# Patient Record
Sex: Female | Born: 1990 | Hispanic: No | Marital: Single | Smoking: Never smoker
Health system: Southern US, Community
[De-identification: ages and names within clinical notes are randomized; demographics above are authoritative.]

## PROBLEM LIST (undated history)

## (undated) HISTORY — PX: PLACEMENT OF BREAST IMPLANTS: SHX6334

---
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
## 2011-08-23 DIAGNOSIS — S63289A Dislocation of proximal interphalangeal joint of unspecified finger, initial encounter: Secondary | ICD-10-CM | POA: Insufficient documentation

## 2013-04-20 DIAGNOSIS — R070 Pain in throat: Secondary | ICD-10-CM | POA: Insufficient documentation

## 2015-07-04 ENCOUNTER — Other Ambulatory Visit (HOSPITAL_COMMUNITY)
Admission: RE | Admit: 2015-07-04 | Discharge: 2015-07-04 | Disposition: A | Discharge status: Home / Self Care | Payer: BLUE CROSS/BLUE SHIELD | Source: Ambulatory Visit | Attending: Family | Admitting: Family

## 2015-07-04 ENCOUNTER — Other Ambulatory Visit: Payer: Self-pay

## 2015-07-04 DIAGNOSIS — Z01411 Encounter for gynecological examination (general) (routine) with abnormal findings: Secondary | ICD-10-CM | POA: Diagnosis not present

## 2015-07-04 DIAGNOSIS — N760 Acute vaginitis: Secondary | ICD-10-CM | POA: Diagnosis present

## 2015-07-04 DIAGNOSIS — Z113 Encounter for screening for infections with a predominantly sexual mode of transmission: Secondary | ICD-10-CM | POA: Insufficient documentation

## 2015-07-06 LAB — CYTOLOGY - PAP

## 2016-02-14 DIAGNOSIS — Z3041 Encounter for surveillance of contraceptive pills: Secondary | ICD-10-CM | POA: Diagnosis not present

## 2017-04-09 DIAGNOSIS — Z3041 Encounter for surveillance of contraceptive pills: Secondary | ICD-10-CM | POA: Diagnosis not present

## 2017-07-19 DIAGNOSIS — Z113 Encounter for screening for infections with a predominantly sexual mode of transmission: Secondary | ICD-10-CM | POA: Diagnosis not present

## 2017-07-19 DIAGNOSIS — N760 Acute vaginitis: Secondary | ICD-10-CM | POA: Diagnosis not present

## 2017-07-19 DIAGNOSIS — Z114 Encounter for screening for human immunodeficiency virus [HIV]: Secondary | ICD-10-CM | POA: Diagnosis not present

## 2017-07-22 ENCOUNTER — Other Ambulatory Visit: Payer: Self-pay | Admitting: Obstetrics and Gynecology

## 2017-07-22 ENCOUNTER — Other Ambulatory Visit (HOSPITAL_COMMUNITY)
Admission: RE | Admit: 2017-07-22 | Discharge: 2017-07-22 | Disposition: A | Discharge status: Home / Self Care | Payer: BLUE CROSS/BLUE SHIELD | Source: Ambulatory Visit | Attending: Obstetrics and Gynecology | Admitting: Obstetrics and Gynecology

## 2017-07-22 DIAGNOSIS — N898 Other specified noninflammatory disorders of vagina: Secondary | ICD-10-CM | POA: Diagnosis not present

## 2017-07-22 DIAGNOSIS — Z124 Encounter for screening for malignant neoplasm of cervix: Secondary | ICD-10-CM | POA: Insufficient documentation

## 2017-07-22 DIAGNOSIS — Z01419 Encounter for gynecological examination (general) (routine) without abnormal findings: Secondary | ICD-10-CM | POA: Diagnosis not present

## 2017-07-23 LAB — CYTOLOGY - PAP: Diagnosis: NEGATIVE

## 2017-09-16 DIAGNOSIS — N761 Subacute and chronic vaginitis: Secondary | ICD-10-CM | POA: Diagnosis not present

## 2017-09-27 DIAGNOSIS — Z113 Encounter for screening for infections with a predominantly sexual mode of transmission: Secondary | ICD-10-CM | POA: Diagnosis not present

## 2017-09-27 DIAGNOSIS — Z114 Encounter for screening for human immunodeficiency virus [HIV]: Secondary | ICD-10-CM | POA: Diagnosis not present

## 2017-12-18 DIAGNOSIS — L237 Allergic contact dermatitis due to plants, except food: Secondary | ICD-10-CM | POA: Diagnosis not present

## 2018-02-25 ENCOUNTER — Encounter: Payer: Self-pay | Admitting: Family Medicine

## 2018-02-25 ENCOUNTER — Ambulatory Visit: Payer: BLUE CROSS/BLUE SHIELD | Admitting: Family Medicine

## 2018-02-25 VITALS — BP 104/66 | HR 81 | Temp 98.6°F | Ht 66.0 in | Wt 132.2 lb

## 2018-02-25 DIAGNOSIS — Z0184 Encounter for antibody response examination: Secondary | ICD-10-CM | POA: Diagnosis not present

## 2018-02-25 DIAGNOSIS — Z0000 Encounter for general adult medical examination without abnormal findings: Secondary | ICD-10-CM

## 2018-02-25 DIAGNOSIS — Z13220 Encounter for screening for lipoid disorders: Secondary | ICD-10-CM

## 2018-02-25 DIAGNOSIS — Z23 Encounter for immunization: Secondary | ICD-10-CM

## 2018-02-25 DIAGNOSIS — R5383 Other fatigue: Secondary | ICD-10-CM | POA: Diagnosis not present

## 2018-02-25 LAB — LIPID PANEL
Cholesterol: 200 mg/dL (ref 0–200)
HDL: 58.8 mg/dL (ref >39.00)
LDL Cholesterol: 129 mg/dL — ABNORMAL HIGH (ref 0–99)
NonHDL: 141.42
Total CHOL/HDL Ratio: 3
Triglycerides: 62 mg/dL (ref 0.0–149.0)
VLDL: 12.4 mg/dL (ref 0.0–40.0)

## 2018-02-25 LAB — COMPREHENSIVE METABOLIC PANEL
ALT: 17 U/L (ref 0–35)
AST: 19 U/L (ref 0–37)
Albumin: 4.3 g/dL (ref 3.5–5.2)
Alkaline Phosphatase: 27 U/L — ABNORMAL LOW (ref 39–117)
BUN: 10 mg/dL (ref 6–23)
CO2: 26 mEq/L (ref 19–32)
Calcium: 9.3 mg/dL (ref 8.4–10.5)
Chloride: 104 mEq/L (ref 96–112)
Creatinine, Ser: 0.75 mg/dL (ref 0.40–1.20)
GFR: 98.49 mL/min (ref >60.00)
Glucose, Bld: 91 mg/dL (ref 70–99)
Potassium: 4.1 mEq/L (ref 3.5–5.1)
Sodium: 137 mEq/L (ref 135–145)
Total Bilirubin: 0.6 mg/dL (ref 0.2–1.2)
Total Protein: 7.2 g/dL (ref 6.0–8.3)

## 2018-02-25 LAB — CBC WITH DIFFERENTIAL/PLATELET
Basophils Absolute: 0 10*3/uL (ref 0.0–0.1)
Basophils Relative: 0.6 % (ref 0.0–3.0)
Eosinophils Absolute: 0.2 10*3/uL (ref 0.0–0.7)
Eosinophils Relative: 4.2 % (ref 0.0–5.0)
HCT: 38.9 % (ref 36.0–46.0)
Hemoglobin: 13 g/dL (ref 12.0–15.0)
Lymphocytes Relative: 50.7 % — ABNORMAL HIGH (ref 12.0–46.0)
Lymphs Abs: 2.4 10*3/uL (ref 0.7–4.0)
MCHC: 33.4 g/dL (ref 30.0–36.0)
MCV: 94.3 fl (ref 78.0–100.0)
Monocytes Absolute: 0.3 10*3/uL (ref 0.1–1.0)
Monocytes Relative: 7.3 % (ref 3.0–12.0)
Neutro Abs: 1.8 10*3/uL (ref 1.4–7.7)
Neutrophils Relative %: 37.2 % — ABNORMAL LOW (ref 43.0–77.0)
Platelets: 261 10*3/uL (ref 150.0–400.0)
RBC: 4.12 Mil/uL (ref 3.87–5.11)
RDW: 12.7 % (ref 11.5–15.5)
WBC: 4.7 10*3/uL (ref 4.0–10.5)

## 2018-02-25 NOTE — Progress Notes (Signed)
Subjective:    Sherri Moreno is a 27 y.o. female and is here for a comprehensive physical exam.   Current Outpatient Medications:  .  Ferrous Sulfate (IRON SUPPLEMENT PO), Take 45 mg by mouth., Disp: , Rfl:  .  BLISOVI FE 1.5/30 1.5-30 MG-MCG tablet, TK 1 T PO QD, Disp: , Rfl: 3  Health Maintenance Due  Topic Date Due  . HIV Screening  02/11/2006  . TETANUS/TDAP  02/11/2010  . INFLUENZA VACCINE  12/19/2017    PMHx, SurgHx, SocialHx, Medications, and Allergies were reviewed in the Visit Navigator and updated as appropriate.   History reviewed. No pertinent past medical history.   Past Surgical History:  Procedure Laterality Date  . PLACEMENT OF BREAST IMPLANTS       Family History  Problem Relation Age of Onset  . Depression Mother   . Heart attack Father     Social History   Tobacco Use  . Smoking status: Never Smoker  . Smokeless tobacco: Never Used  Substance Use Topics  . Alcohol use: Yes    Comment: social   . Drug use: Never    Review of Systems:   Pertinent items are noted in the HPI. Otherwise, ROS is negative.  Objective:   BP 104/66   Pulse 81   Temp 98.6 F (37 C) (Oral)   Ht 5\' 6"  (1.676 m)   Wt 132 lb 3.2 oz (60 kg)   LMP 02/23/2018   SpO2 100%   BMI 21.34 kg/m    General appearance: alert, cooperative and appears stated age. Head: normocephalic, without obvious abnormality, atraumatic. Neck: no adenopathy, supple, symmetrical, trachea midline; thyroid not enlarged, symmetric, no tenderness/mass/nodules. Lungs: clear to auscultation bilaterally. Heart: regular rate and rhythm Abdomen: soft, non-tender; no masses,  no organomegaly. Extremities: extremities normal, atraumatic, no cyanosis or edema. Skin: skin color, texture, turgor normal, no rashes or lesions. Lymph: cervical, supraclavicular, and axillary nodes normal; no abnormal inguinal nodes palpated. Neurologic: grossly normal.  Assessment/Plan:   Sherri Moreno was seen today for  establish care.  Diagnoses and all orders for this visit:  Routine physical examination  Encounter for screening for lipid disorder -     Lipid panel  Fatigue, unspecified type -     CBC with Differential/Platelet -     Comprehensive metabolic panel  Immunity status testing -     Varicella zoster antibody, IgG  Need for immunization against influenza -     Flu Vaccine QUAD 36+ mos IM  Need for prophylactic vaccination against diphtheria-tetanus-pertussis (DTP) -     Tdap vaccine greater than or equal to 7yo IM   Patient Counseling:   [x]     Nutrition: Stressed importance of moderation in sodium/caffeine intake, saturated fat and cholesterol, caloric balance, sufficient intake of fresh fruits, vegetables, fiber, calcium, iron, and 1 mg of folate supplement per day (for females capable of pregnancy).   [x]      Stressed the importance of regular exercise.    [x]     Substance Abuse: Discussed cessation/primary prevention of tobacco, alcohol, or other drug use; driving or other dangerous activities under the influence; availability of treatment for abuse.    [x]      Injury prevention: Discussed safety belts, safety helmets, smoke detector, smoking near bedding or upholstery.    [x]      Sexuality: Discussed sexually transmitted diseases, partner selection, use of condoms, avoidance of unintended pregnancy  and contraceptive alternatives.    [x]     Dental health: Discussed importance  of regular tooth brushing, flossing, and dental visits.   [x]      Health maintenance and immunizations reviewed. Please refer to Health maintenance section.   Helane Rima, DO [facility]

## 2018-02-26 LAB — VARICELLA ZOSTER ANTIBODY, IGG: Varicella IgG: 605 index

## 2018-03-03 ENCOUNTER — Telehealth: Payer: Self-pay | Admitting: Family Medicine

## 2018-03-03 NOTE — Telephone Encounter (Signed)
See note  Copied from CRM [id number]. Topic: General - Other >> Mar 03, 2018  9:52 AM Percival Spanish wrote:  Pt is needing a sign note saying that she had the flu shot .Will pick up when ready

## 2018-03-03 NOTE — Telephone Encounter (Signed)
Sherri Moreno has taken care of this and documented in lab results.

## 2018-12-22 ENCOUNTER — Other Ambulatory Visit: Payer: Self-pay | Admitting: Obstetrics and Gynecology

## 2018-12-22 ENCOUNTER — Other Ambulatory Visit (HOSPITAL_COMMUNITY)
Admission: RE | Admit: 2018-12-22 | Discharge: 2018-12-22 | Disposition: A | Discharge status: Home / Self Care | Payer: BLUE CROSS/BLUE SHIELD | Source: Ambulatory Visit | Attending: Obstetrics and Gynecology | Admitting: Obstetrics and Gynecology

## 2018-12-22 DIAGNOSIS — Z01419 Encounter for gynecological examination (general) (routine) without abnormal findings: Secondary | ICD-10-CM | POA: Diagnosis not present

## 2018-12-26 LAB — CYTOLOGY - PAP: HPV: NOT DETECTED

## 2019-04-17 ENCOUNTER — Emergency Department (HOSPITAL_COMMUNITY)
Admission: EM | Admit: 2019-04-17 | Discharge: 2019-04-17 | Disposition: A | Discharge status: Home / Self Care | Payer: Medicaid Other | Attending: Emergency Medicine | Admitting: Emergency Medicine

## 2019-04-17 ENCOUNTER — Other Ambulatory Visit: Payer: Self-pay

## 2019-04-17 DIAGNOSIS — Z5321 Procedure and treatment not carried out due to patient leaving prior to being seen by health care provider: Secondary | ICD-10-CM

## 2019-04-23 DIAGNOSIS — Z03818 Encounter for observation for suspected exposure to other biological agents ruled out: Secondary | ICD-10-CM | POA: Diagnosis not present

## 2019-04-23 DIAGNOSIS — Z20828 Contact with and (suspected) exposure to other viral communicable diseases: Secondary | ICD-10-CM | POA: Diagnosis not present

## 2019-12-23 DIAGNOSIS — R350 Frequency of micturition: Secondary | ICD-10-CM | POA: Diagnosis not present

## 2019-12-23 DIAGNOSIS — Z01419 Encounter for gynecological examination (general) (routine) without abnormal findings: Secondary | ICD-10-CM | POA: Diagnosis not present

## 2020-04-22 ENCOUNTER — Encounter: Payer: Medicaid Other | Admitting: Family Medicine

## 2020-05-02 ENCOUNTER — Other Ambulatory Visit: Payer: Self-pay

## 2020-05-02 ENCOUNTER — Encounter: Payer: Self-pay | Admitting: Family Medicine

## 2020-05-02 ENCOUNTER — Ambulatory Visit (INDEPENDENT_AMBULATORY_CARE_PROVIDER_SITE_OTHER): Payer: BC Managed Care – PPO | Admitting: Family Medicine

## 2020-05-02 VITALS — BP 108/68 | HR 74 | Temp 97.7°F | Ht 66.0 in | Wt 130.6 lb

## 2020-05-02 DIAGNOSIS — Z0001 Encounter for general adult medical examination with abnormal findings: Secondary | ICD-10-CM

## 2020-05-02 DIAGNOSIS — Z111 Encounter for screening for respiratory tuberculosis: Secondary | ICD-10-CM | POA: Diagnosis not present

## 2020-05-02 DIAGNOSIS — E7800 Pure hypercholesterolemia, unspecified: Secondary | ICD-10-CM

## 2020-05-02 NOTE — Assessment & Plan Note (Signed)
Check lipids, CBC, CMET, TSH.  Discussed lifestyle modifications. 

## 2020-05-02 NOTE — Progress Notes (Signed)
Chief Complaint:  Sherri Moreno is a 29 y.o. female who presents today for her annual comprehensive physical exam.    Assessment/Plan:  Chronic Problems Addressed Today: Elevated LDL cholesterol level Check lipids, CBC, CMET, TSH. Discussed lifestyle modifications.   Preventative Healthcare: PPD given today. UTD on vaccines and screenings. Check CBC, CMET, TSH, and lipids.   Patient Counseling(The following topics were reviewed and/or handout was given):  -Nutrition: Stressed importance of moderation in sodium/caffeine intake, saturated fat and cholesterol, caloric balance, sufficient intake of fresh fruits, vegetables, and fiber.  -Stressed the importance of regular exercise.   -Substance Abuse: Discussed cessation/primary prevention of tobacco, alcohol, or other drug use; driving or other dangerous activities under the influence; availability of treatment for abuse.   -Injury prevention: Discussed safety belts, safety helmets, smoke detector, smoking near bedding or upholstery.   -Sexuality: Discussed sexually transmitted diseases, partner selection, use of condoms, avoidance of unintended pregnancy and contraceptive alternatives.   -Dental health: Discussed importance of regular tooth brushing, flossing, and dental visits.  -Health maintenance and immunizations reviewed. Please refer to Health maintenance section.  Return to care in 1 year for next preventative visit.     Subjective:  HPI:  She has no acute complaints today.   Lifestyle Diet: Tries eat healthy. Tries to get plenty of fruits and vegetables.  Exercise: Exercise as much as possible.   Depression screen PHQ 2/9 05/02/2020  Decreased Interest 0  Down, Depressed, Hopeless 0  PHQ - 2 Score 0    Health Maintenance Due  Topic Date Due  . Hepatitis C Screening  Never done     ROS: Per HPI, otherwise a complete review of systems was negative.   PMH:  The following were reviewed and entered/updated in  epic: History reviewed. No pertinent past medical history. Patient Active Problem List   Diagnosis Date Noted  . Elevated LDL cholesterol level 05/02/2020   Past Surgical History:  Procedure Laterality Date  . PLACEMENT OF BREAST IMPLANTS      Family History  Problem Relation Age of Onset  . Depression Mother   . Heart attack Father     Medications- reviewed and updated Current Outpatient Medications  Medication Sig Dispense Refill  . BLISOVI FE 1.5/30 1.5-30 MG-MCG tablet TK 1 T PO QD  3   No current facility-administered medications for this visit.    Allergies-reviewed and updated Allergies  Allergen Reactions  . Ibuprofen Swelling    Lips swell     Social History   Socioeconomic History  . Marital status: Single    Spouse name: Not on file  . Number of children: Not on file  . Years of education: Not on file  . Highest education level: Not on file  Occupational History  . Not on file  Tobacco Use  . Smoking status: Never Smoker  . Smokeless tobacco: Never Used  Substance and Sexual Activity  . Alcohol use: Yes    Comment: social   . Drug use: Never  . Sexual activity: Yes    Partners: Male    Birth control/protection: Pill  Other Topics Concern  . 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    Social Determinants of Health   Financial Resource Strain: Not on file  Food Insecurity: Not on file  Transportation Needs: Not on file  Physical Activity: Not on file  Stress: Not on file  Social Connections: Not on file  Objective:  Physical Exam: BP 108/68   Pulse 74   Temp 97.7 F (36.5 C) (Temporal)   Ht 5\' 6"  (1.676 m)   Wt 130 lb 9.6 oz (59.2 kg)   LMP 04/16/2020   SpO2 100%   BMI 21.08 kg/m   Body mass index is 21.08 kg/m. Wt Readings from Last 3 Encounters:  05/02/20 130 lb 9.6 oz (59.2 kg)  02/25/18 132 lb 3.2 oz (60 kg)   Gen: NAD, resting comfortably HEENT: TMs normal bilaterally. OP clear.  No thyromegaly noted.  CV: RRR with no murmurs appreciated Pulm: NWOB, CTAB with no crackles, wheezes, or rhonchi GI: Normal bowel sounds present. Soft, Nontender, Nondistended. MSK: no edema, cyanosis, or clubbing noted Skin: warm, dry Neuro: CN2-12 grossly intact. Strength 5/5 in upper and lower extremities. Reflexes symmetric and intact bilaterally.  Psych: Normal affect and thought content     Pasha Gadison M. 04/27/18, MD 05/02/2020 9:53 AM

## 2020-05-02 NOTE — Patient Instructions (Signed)
It was very nice to see you today!  We will check lab work today.  We will give you your TB skin test.   We will see you back in a year or so.  Please come back to see me sooner if needed.  Take care, Dr Jerline Pain  Please try these tips to maintain a healthy lifestyle:   Eat at least 3 REAL meals and 1-2 snacks per day.  Aim for no more than 5 hours between eating.  If you eat breakfast, please do so within one hour of getting up.    Each meal should contain half fruits/vegetables, one quarter protein, and one quarter carbs (no bigger than a computer mouse)   Cut down on sweet beverages. This includes juice, soda, and sweet tea.     Drink at least 1 glass of water with each meal and aim for at least 8 glasses per day   Exercise at least 150 minutes every week.    Preventive Care 73-79 Years Old, Female Preventive care refers to visits with your health care provider and lifestyle choices that can promote health and wellness. This includes:  A yearly physical exam. This may also be called an annual well check.  Regular dental visits and eye exams.  Immunizations.  Screening for certain conditions.  Healthy lifestyle choices, such as eating a healthy diet, getting regular exercise, not using drugs or products that contain nicotine and tobacco, and limiting alcohol use. What can I expect for my preventive care visit? Physical exam Your health care provider will check your:  Height and weight. This may be used to calculate body mass index (BMI), which tells if you are at a healthy weight.  Heart rate and blood pressure.  Skin for abnormal spots. Counseling Your health care provider may ask you questions about your:  Alcohol, tobacco, and drug use.  Emotional well-being.  Home and relationship well-being.  Sexual activity.  Eating habits.  Work and work Statistician.  Method of birth control.  Menstrual cycle.  Pregnancy history. What immunizations do I  need?  Influenza (flu) vaccine  This is recommended every year. Tetanus, diphtheria, and pertussis (Tdap) vaccine  You may need a Td booster every 10 years. Varicella (chickenpox) vaccine  You may need this if you have not been vaccinated. Human papillomavirus (HPV) vaccine  If recommended by your health care provider, you may need three doses over 6 months. Measles, mumps, and rubella (MMR) vaccine  You may need at least one dose of MMR. You may also need a second dose. Meningococcal conjugate (MenACWY) vaccine  One dose is recommended if you are age 28-21 years and a first-year college student living in a residence hall, or if you have one of several medical conditions. You may also need additional booster doses. Pneumococcal conjugate (PCV13) vaccine  You may need this if you have certain conditions and were not previously vaccinated. Pneumococcal polysaccharide (PPSV23) vaccine  You may need one or two doses if you smoke cigarettes or if you have certain conditions. Hepatitis A vaccine  You may need this if you have certain conditions or if you travel or work in places where you may be exposed to hepatitis A. Hepatitis B vaccine  You may need this if you have certain conditions or if you travel or work in places where you may be exposed to hepatitis B. Haemophilus influenzae type b (Hib) vaccine  You may need this if you have certain conditions. You may receive vaccines as  individual doses or as more than one vaccine together in one shot (combination vaccines). Talk with your health care provider about the risks and benefits of combination vaccines. What tests do I need?  Blood tests  Lipid and cholesterol levels. These may be checked every 5 years starting at age 2.  Hepatitis C test.  Hepatitis B test. Screening  Diabetes screening. This is done by checking your blood sugar (glucose) after you have not eaten for a while (fasting).  Sexually transmitted disease  (STD) testing.  BRCA-related cancer screening. This may be done if you have a family history of breast, ovarian, tubal, or peritoneal cancers.  Pelvic exam and Pap test. This may be done every 3 years starting at age 12. Starting at age 54, this may be done every 5 years if you have a Pap test in combination with an HPV test. Talk with your health care provider about your test results, treatment options, and if necessary, the need for more tests. Follow these instructions at home: Eating and drinking   Eat a diet that includes fresh fruits and vegetables, whole grains, lean protein, and low-fat dairy.  Take vitamin and mineral supplements as recommended by your health care provider.  Do not drink alcohol if: ? Your health care provider tells you not to drink. ? You are pregnant, may be pregnant, or are planning to become pregnant.  If you drink alcohol: ? Limit how much you have to 0-1 drink a day. ? Be aware of how much alcohol is in your drink. In the [place] one drink equals one 12 oz bottle of beer (355 mL), one 5 oz glass of wine (148 mL), or one 1 oz glass of hard liquor (44 mL). Lifestyle  Take daily care of your teeth and gums.  Stay active. Exercise for at least 30 minutes on 5 or more days each week.  Do not use any products that contain nicotine or tobacco, such as cigarettes, e-cigarettes, and chewing tobacco. If you need help quitting, ask your health care provider.  If you are sexually active, practice safe sex. Use a condom or other form of birth control (contraception) in order to prevent pregnancy and STIs (sexually transmitted infections). If you plan to become pregnant, see your health care provider for a preconception visit. What's next?  Visit your health care provider once a year for a well check visit.  Ask your health care provider how often you should have your eyes and teeth checked.  Stay up to date on all vaccines. This information is not intended to  replace advice given to you by your health care provider. Make sure you discuss any questions you have with your health care provider. Document Revised: 01/16/2018 Document Reviewed: 01/16/2018 Elsevier Patient Education  2020 [employer].

## 2020-05-03 LAB — COMPREHENSIVE METABOLIC PANEL
AG Ratio: 1.5 (calc) (ref 1.0–2.5)
ALT: 16 U/L (ref 6–29)
AST: 17 U/L (ref 10–30)
Albumin: 3.9 g/dL (ref 3.6–5.1)
Alkaline phosphatase (APISO): 25 U/L — ABNORMAL LOW (ref 31–125)
BUN: 9 mg/dL (ref 7–25)
CO2: 25 mmol/L (ref 20–32)
Calcium: 9 mg/dL (ref 8.6–10.2)
Chloride: 106 mmol/L (ref 98–110)
Creat: 0.77 mg/dL (ref 0.50–1.10)
Globulin: 2.6 g/dL (calc) (ref 1.9–3.7)
Glucose, Bld: 82 mg/dL (ref 65–99)
Potassium: 3.9 mmol/L (ref 3.5–5.3)
Sodium: 138 mmol/L (ref 135–146)
Total Bilirubin: 0.7 mg/dL (ref 0.2–1.2)
Total Protein: 6.5 g/dL (ref 6.1–8.1)

## 2020-05-03 LAB — CBC
HCT: 36.8 % (ref 35.0–45.0)
Hemoglobin: 12.6 g/dL (ref 11.7–15.5)
MCH: 31.5 pg (ref 27.0–33.0)
MCHC: 34.2 g/dL (ref 32.0–36.0)
MCV: 92 fL (ref 80.0–100.0)
MPV: 10.2 fL (ref 7.5–12.5)
Platelets: 269 10*3/uL (ref 140–400)
RBC: 4 10*6/uL (ref 3.80–5.10)
RDW: 11.8 % (ref 11.0–15.0)
WBC: 6.6 10*3/uL (ref 3.8–10.8)

## 2020-05-03 LAB — LIPID PANEL
Cholesterol: 181 mg/dL (ref <200)
HDL: 65 mg/dL (ref >=50)
LDL Cholesterol (Calc): 100 mg/dL (calc) — ABNORMAL HIGH
Non-HDL Cholesterol (Calc): 116 mg/dL (calc) (ref <130)
Total CHOL/HDL Ratio: 2.8 (calc) (ref <5.0)
Triglycerides: 74 mg/dL (ref <150)

## 2020-05-03 LAB — TSH: TSH: 4.59 mIU/L — ABNORMAL HIGH

## 2020-05-03 NOTE — Progress Notes (Signed)
Please inform patient of the following:  Thyroid level slightly off. Recommend she come back to check TSH, free t3 and free t4. Everything else within expected ranges.  Sherri Moreno. Jimmey Ralph, MD 05/03/2020 4:22 PM    Sherri Moreno. Jimmey Ralph, MD 05/03/2020 4:22 PM

## 2020-05-04 ENCOUNTER — Other Ambulatory Visit: Payer: Self-pay | Admitting: *Deleted

## 2020-05-04 ENCOUNTER — Ambulatory Visit: Payer: BC Managed Care – PPO

## 2020-05-04 ENCOUNTER — Other Ambulatory Visit: Payer: Self-pay

## 2020-05-04 DIAGNOSIS — R7989 Other specified abnormal findings of blood chemistry: Secondary | ICD-10-CM

## 2020-05-04 DIAGNOSIS — Z111 Encounter for screening for respiratory tuberculosis: Secondary | ICD-10-CM

## 2020-05-04 LAB — TB SKIN TEST: TB Skin Test: NEGATIVE

## 2020-05-04 NOTE — Progress Notes (Signed)
Pt was in the office today for a TB reading. Results were negative.

## 2020-06-15 DIAGNOSIS — Z111 Encounter for screening for respiratory tuberculosis: Secondary | ICD-10-CM | POA: Diagnosis not present

## 2020-08-16 ENCOUNTER — Encounter: Payer: Self-pay | Admitting: Plastic Surgery

## 2020-08-16 ENCOUNTER — Ambulatory Visit (INDEPENDENT_AMBULATORY_CARE_PROVIDER_SITE_OTHER): Payer: BC Managed Care – PPO | Admitting: Family Medicine

## 2020-08-16 ENCOUNTER — Ambulatory Visit (INDEPENDENT_AMBULATORY_CARE_PROVIDER_SITE_OTHER): Payer: Self-pay | Admitting: Plastic Surgery

## 2020-08-16 ENCOUNTER — Other Ambulatory Visit: Payer: Self-pay

## 2020-08-16 VITALS — BP 98/61 | HR 72 | Temp 98.4°F | Ht 66.0 in | Wt 132.6 lb

## 2020-08-16 DIAGNOSIS — R140 Abdominal distension (gaseous): Secondary | ICD-10-CM

## 2020-08-16 DIAGNOSIS — R7989 Other specified abnormal findings of blood chemistry: Secondary | ICD-10-CM | POA: Diagnosis not present

## 2020-08-16 DIAGNOSIS — Z719 Counseling, unspecified: Secondary | ICD-10-CM | POA: Insufficient documentation

## 2020-08-16 DIAGNOSIS — K5900 Constipation, unspecified: Secondary | ICD-10-CM | POA: Diagnosis not present

## 2020-08-16 LAB — T3, FREE: T3, Free: 2.8 pg/mL (ref 2.3–4.2)

## 2020-08-16 LAB — TSH: TSH: 1.95 u[IU]/mL (ref 0.35–4.50)

## 2020-08-16 LAB — T4, FREE: Free T4: 0.76 ng/dL (ref 0.60–1.60)

## 2020-08-16 NOTE — Progress Notes (Signed)
Patient ID: Sherri Moreno, female    DOB: 1990/12/13, 30 y.o.   MRN: [id number]   Chief Complaint  Patient presents with  . Advice Only  . Breast Problem    The patient is a 30 year old female here with her mom.  She has questions about her breasts.  She underwent a breast augmentation by Dr. Stephens November 07/30/14.  The implants are saline and 300 cc.  She believes that they are smooth implants.  They are under the muscle.  She is not a smoker and does not have diabetes.  She is a physical therapy student.  Her sternal notch to right nipple is 22 cm left nipple is 20 cm.  Her sternal notch to right areola is 20 cm on the right and 19 cm on the left.  She does not like the asymmetry in the areola and nipple area.  She is 5 feet 6 inches tall weighs 139 pounds.  She went from a AA to a 34C cup.  She feels like when she sleeps the implants come to close together.  She has been putting a stuffed animal or a pillow between her breasts.  The left breast is a little bit lower than the right at the inferior pole.  This might be improved with excision of an ellipse of skin on the lower pole.  It might not pull the areola down but it might help.   Review of Systems  Constitutional: Negative for activity change and appetite change.  Eyes: Negative.   Respiratory: Negative.  Negative for chest tightness and shortness of breath.   Cardiovascular: Negative for leg swelling.  Gastrointestinal: Negative for abdominal distention and abdominal pain.  Endocrine: Negative.   Genitourinary: Negative.   Musculoskeletal: Negative.   Hematological: Negative.     History reviewed. No pertinent past medical history.  Past Surgical History:  Procedure Laterality Date  . PLACEMENT OF BREAST IMPLANTS        Current Outpatient Medications:  .  BLISOVI FE 1.5/30 1.5-30 MG-MCG tablet, TK 1 T PO QD, Disp: , Rfl: 3   Objective:   Vitals:   08/16/20 1510  BP: 120/70  Pulse: 78  SpO2: 98%    Physical  Exam Vitals and nursing note reviewed.  Constitutional:      Appearance: Normal appearance.  HENT:     Head: Normocephalic and atraumatic.  Cardiovascular:     Rate and Rhythm: Normal rate.     Pulses: Normal pulses.  Pulmonary:     Effort: Pulmonary effort is normal.  Abdominal:     General: Abdomen is flat. There is no distension.     Tenderness: There is no abdominal tenderness.  Skin:    General: Skin is warm.     Capillary Refill: Capillary refill takes less than 2 seconds.     Coloration: Skin is not jaundiced.  Neurological:     General: No focal deficit present.     Mental Status: She is alert and oriented to person, place, and time.  Psychiatric:        Mood and Affect: Mood normal.        Behavior: Behavior normal.     Assessment & Plan:  Encounter for counseling  We discussed the options for revision.  I would be very hesitant to raise the right nipple.  I think that the right nipple areola is in the proper position.  I think the left is a little bit high and attempt could be made  at low-lying that side.  With physical therapy school and training she does not want to do anything until May when she graduates.  She is going to think things over and be back in touch if she decides to do anything.  Alena Bills Lometa Riggin, DO

## 2020-08-16 NOTE — Progress Notes (Signed)
Please inform patient of the following:  Thyroid levels are all NORMAL. We can refer to GI as we discussed or we can try bowel cleanout first to make sure constipation is not causing her symptoms.  Sherri Moreno. Jimmey Ralph, MD 08/16/2020 3:06 PM

## 2020-08-16 NOTE — Progress Notes (Signed)
   Sherri Moreno is a 30 y.o. female who presents today for an office visit.  Assessment/Plan:  Bloating / Constipation Recheck TSH as this was noted to be slightly elevated on last check.  If she does indeed have hypothyroidism we will treat this before exploring other causes of bloating and constipation.  If TSH is normal would consider IBS or other primary GI issue. May have IBS or food intoleraance given her history though dose have constipation which is likely playing a role.  Recommended over-the-counter stool softeners and laxatives such as MiraLAX as needed.  May need referral to GI.     Subjective:  HPI:  Patient here with concerns for abdominal bloating.  This happens intermittently.  Usually happens after eating specific foods such as bread.  Can sometimes happen after eating salad.  Symptoms last for short while and then subside.  This has been going on for at least a couple of years.  No nausea or vomiting.  No epigastric pain.  Occasionally has heartburn after eating specific foods such as pizza but has not had this persistently.  She has intermittent constipation will sometimes go 2 to 3 days between bowel movements.  No diarrhea.  No melena or hematochezia.  She has tried taking Metamucil without significant improvement.  No unintentional weight loss.  No reported early satiety.       Objective:  Physical Exam: BP 98/61   Pulse 72   Temp 98.4 F (36.9 C) (Temporal)   Ht 5\' 6"  (1.676 m)   Wt 132 lb 9.6 oz (60.1 kg)   SpO2 100%   BMI 21.40 kg/m   Gen: No acute distress, resting comfortably CV: Regular rate and rhythm with no murmurs appreciated Pulm: Normal work of breathing, clear to auscultation bilaterally with no crackles, wheezes, or rhonchi GI: S, NT, ND Neuro: Grossly normal, moves all extremities Psych: Normal affect and thought content      Sherri Moreno M. , MD 08/16/2020 10:52 AM

## 2020-08-16 NOTE — Patient Instructions (Signed)
It was very nice to see you today!  We will recheck your thyroid today.  This could be causing some of your symptoms.  If your thyroid level is normal and your symptoms persist we can discuss referring you to see a GI specialist or treatment for IBS.  Take care, Dr Jimmey Ralph  PLEASE NOTE:  If you had any lab tests please let us know if you have not heard back within a few days. You may see your results on mychart before we have a chance to review them but we will give you a call once they are reviewed by [place]. If we ordered any referrals today, please let us know if you have not heard from their office within the next week.   Please try these tips to maintain a healthy lifestyle:   Eat at least 3 REAL meals and 1-2 snacks per day.  Aim for no more than 5 hours between eating.  If you eat breakfast, please do so within one hour of getting up.    Each meal should contain half fruits/vegetables, one quarter protein, and one quarter carbs (no bigger than a computer mouse)   Cut down on sweet beverages. This includes juice, soda, and sweet tea.     Drink at least 1 glass of water with each meal and aim for at least 8 glasses per day   Exercise at least 150 minutes every week.

## 2020-10-18 ENCOUNTER — Ambulatory Visit (HOSPITAL_BASED_OUTPATIENT_CLINIC_OR_DEPARTMENT_OTHER)
Admission: RE | Admit: 2020-10-18 | Discharge: 2020-10-18 | Disposition: A | Discharge status: Home / Self Care | Payer: BC Managed Care – PPO | Source: Ambulatory Visit | Attending: Family Medicine | Admitting: Family Medicine

## 2020-10-18 ENCOUNTER — Ambulatory Visit (INDEPENDENT_AMBULATORY_CARE_PROVIDER_SITE_OTHER): Payer: BC Managed Care – PPO | Admitting: Family Medicine

## 2020-10-18 ENCOUNTER — Encounter: Payer: Self-pay | Admitting: Family Medicine

## 2020-10-18 ENCOUNTER — Other Ambulatory Visit: Payer: Self-pay

## 2020-10-18 VITALS — BP 102/79 | HR 95 | Temp 98.7°F | Ht 66.0 in | Wt 133.0 lb

## 2020-10-18 DIAGNOSIS — R109 Unspecified abdominal pain: Secondary | ICD-10-CM

## 2020-10-18 LAB — POCT URINALYSIS DIPSTICK
Glucose, UA: NEGATIVE
Ketones, UA: NEGATIVE
Leukocytes, UA: NEGATIVE
Nitrite, UA: NEGATIVE
Protein, UA: POSITIVE — AB
Spec Grav, UA: 1.025 (ref 1.010–1.025)
Urobilinogen, UA: 0.2 E.U./dL
pH, UA: 6.5 (ref 5.0–8.0)

## 2020-10-18 LAB — POCT URINE PREGNANCY: Preg Test, Ur: NEGATIVE

## 2020-10-18 MED ORDER — PHENAZOPYRIDINE HCL 200 MG PO TABS: 200.0000 mg | ORAL_TABLET | Freq: Three times a day (TID) | ORAL | 0 refills | Status: DC | PRN

## 2020-10-18 NOTE — Progress Notes (Signed)
   Sherri Moreno is a 30 y.o. female who presents today for an office visit.  Assessment/Plan:  New/Acute Problems: Left flank pain UA positive for blood.  No signs of UTI will check urine culture to rule out. Urine pregnancy negative. She cannot take NSAIDs due to allergies.  Overall pain is mostly tolerable.  We will start Pyridium.  Given symptoms and then persistent for the past few weeks we will check CT scan to further evaluate.  Encourage good oral hydration.  Discussed reasons to return to care.    Subjective:  HPI:  Patient with several weeks of left-sided flank pain.  No obvious injuries or precipitating events.  Pain has been persistent.  Worse with certain motions.  She is concerned about possible kidney stone.  She does not think pain is muscular.  No specific treatments tried.       Objective:  Physical Exam: BP 102/79   Pulse 95   Temp 98.7 F (37.1 C) (Temporal)   Ht 5\' 6"  (1.676 m)   Wt 133 lb (60.3 kg)   LMP 09/24/2020   SpO2 99%   BMI 21.47 kg/m   Gen: No acute distress, resting comfortably CV: Regular rate and rhythm with no murmurs appreciated Pulm: Normal work of breathing, clear to auscultation bilaterally with no crackles, wheezes, or rhonchi Neuro: Grossly normal, moves all extremities Psych: Normal affect and thought content      Sherri Moreno M. 11/24/2020, MD 10/18/2020 2:48 PM

## 2020-10-18 NOTE — Patient Instructions (Signed)
It was very nice to see you today!  Am concerned he may have a kidney stone.  Please start the Pyridium.  Please make sure that you are getting plenty of fluids.  We will check a CT scan to further assess.  Take care, Dr Jimmey Ralph  PLEASE NOTE:  If you had any lab tests please let us know if you have not heard back within a few days. You may see your results on mychart before we have a chance to review them but we will give you a call once they are reviewed by [place]. If we ordered any referrals today, please let us know if you have not heard from their office within the next week.   Please try these tips to maintain a healthy lifestyle:   Eat at least 3 REAL meals and 1-2 snacks per day.  Aim for no more than 5 hours between eating.  If you eat breakfast, please do so within one hour of getting up.    Each meal should contain half fruits/vegetables, one quarter protein, and one quarter carbs (no bigger than a computer mouse)   Cut down on sweet beverages. This includes juice, soda, and sweet tea.     Drink at least 1 glass of water with each meal and aim for at least 8 glasses per day   Exercise at least 150 minutes every week.

## 2020-10-19 LAB — URINE CULTURE
MICRO NUMBER:: 11950194
Result:: NO GROWTH
SPECIMEN QUALITY:: ADEQUATE

## 2020-10-19 NOTE — Progress Notes (Signed)
Please inform patient of the following:  Her ultrasound showed no abnormalities. It is possible that she could have had a kidney stone that passed and is still having some irritation/pain due to that.  I would like for her to come back in a week to recheck a UA to make sure the blood in her urine has cleared.  I would also like for her to let us know if her pain is not improving over the next several days.  Katina Degree. Jimmey Ralph, MD 10/19/2020 8:10 AM

## 2020-10-20 NOTE — Progress Notes (Signed)
Please inform patient of the following:  Urine culture is negative. She does not have a UTI. Please see CT results - would like for her to let us know if symptoms are not improving.  Katina Degree. Jimmey Ralph, MD 10/20/2020 9:45 AM

## 2020-10-21 ENCOUNTER — Other Ambulatory Visit: Payer: Self-pay | Admitting: *Deleted

## 2020-10-21 DIAGNOSIS — R109 Unspecified abdominal pain: Secondary | ICD-10-CM

## 2020-10-24 ENCOUNTER — Other Ambulatory Visit: Payer: Self-pay

## 2020-10-24 ENCOUNTER — Other Ambulatory Visit (INDEPENDENT_AMBULATORY_CARE_PROVIDER_SITE_OTHER): Payer: BC Managed Care – PPO

## 2020-10-24 ENCOUNTER — Ambulatory Visit: Payer: BC Managed Care – PPO | Admitting: Registered Nurse

## 2020-10-24 DIAGNOSIS — R109 Unspecified abdominal pain: Secondary | ICD-10-CM

## 2020-10-24 DIAGNOSIS — R10A2 Flank pain, left side: Secondary | ICD-10-CM

## 2020-10-24 LAB — URINALYSIS, ROUTINE W REFLEX MICROSCOPIC
Bilirubin Urine: NEGATIVE
Hgb urine dipstick: NEGATIVE
Ketones, ur: NEGATIVE
Leukocytes,Ua: NEGATIVE
Nitrite: NEGATIVE
Specific Gravity, Urine: 1.01 (ref 1.000–1.030)
Total Protein, Urine: NEGATIVE
Urine Glucose: NEGATIVE
Urobilinogen, UA: 0.2 (ref 0.0–1.0)
pH: 6.5 (ref 5.0–8.0)

## 2020-10-25 NOTE — Progress Notes (Signed)
Please inform patient of the following:  She no long has blood in her urine. I believe she had a kidney that has passed. Would like for her to let us know if her symptoms have not improved.  Katina Degree. Jimmey Ralph, MD 10/25/2020 8:11 AM

## 2021-07-04 ENCOUNTER — Other Ambulatory Visit: Payer: Self-pay

## 2021-07-04 ENCOUNTER — Ambulatory Visit (INDEPENDENT_AMBULATORY_CARE_PROVIDER_SITE_OTHER): Payer: Self-pay

## 2021-07-04 DIAGNOSIS — Z111 Encounter for screening for respiratory tuberculosis: Secondary | ICD-10-CM

## 2021-07-06 ENCOUNTER — Ambulatory Visit (INDEPENDENT_AMBULATORY_CARE_PROVIDER_SITE_OTHER): Payer: Self-pay | Admitting: *Deleted

## 2021-07-06 DIAGNOSIS — Z111 Encounter for screening for respiratory tuberculosis: Secondary | ICD-10-CM

## 2021-07-06 LAB — TB SKIN TEST
Induration: 0 mm
TB Skin Test: NEGATIVE

## 2021-07-06 NOTE — Progress Notes (Signed)
I have reviewed the patient's encounter and agree with the documentation.  Katina Degree. Jimmey Ralph, MD 07/06/2021 2:44 PM

## 2021-07-06 NOTE — Progress Notes (Signed)
PPD Reading Note PPD read and results entered in Epic[facility]. Result: 0 mm induration. Interpretation: NEGATIVE    Jobe Gibbon, CMA

## 2021-07-07 ENCOUNTER — Ambulatory Visit (INDEPENDENT_AMBULATORY_CARE_PROVIDER_SITE_OTHER): Payer: Self-pay | Admitting: Plastic Surgery

## 2021-07-07 ENCOUNTER — Other Ambulatory Visit: Payer: Self-pay

## 2021-07-07 ENCOUNTER — Encounter: Payer: Self-pay | Admitting: Plastic Surgery

## 2021-07-07 DIAGNOSIS — Z719 Counseling, unspecified: Secondary | ICD-10-CM

## 2021-07-07 MED ORDER — DOXYCYCLINE HYCLATE 100 MG PO TABS: 100.0000 mg | ORAL_TABLET | Freq: Every day | ORAL | 0 refills | Status: DC

## 2021-07-07 NOTE — Progress Notes (Signed)

## 2021-09-26 ENCOUNTER — Other Ambulatory Visit: Payer: Self-pay | Admitting: Obstetrics and Gynecology

## 2021-09-26 ENCOUNTER — Other Ambulatory Visit (HOSPITAL_COMMUNITY)
Admission: RE | Admit: 2021-09-26 | Discharge: 2021-09-26 | Disposition: A | Discharge status: Home / Self Care | Payer: BC Managed Care – PPO | Source: Ambulatory Visit | Attending: Obstetrics and Gynecology | Admitting: Obstetrics and Gynecology

## 2021-09-26 DIAGNOSIS — Z01419 Encounter for gynecological examination (general) (routine) without abnormal findings: Secondary | ICD-10-CM | POA: Diagnosis not present

## 2021-09-29 LAB — CYTOLOGY - PAP
Comment: NEGATIVE
Diagnosis: NEGATIVE
High risk HPV: NEGATIVE

## 2021-12-19 ENCOUNTER — Encounter: Payer: BC Managed Care – PPO | Admitting: Family Medicine

## 2022-01-02 ENCOUNTER — Encounter: Payer: Self-pay | Admitting: Family Medicine

## 2022-01-02 ENCOUNTER — Ambulatory Visit (INDEPENDENT_AMBULATORY_CARE_PROVIDER_SITE_OTHER): Payer: BC Managed Care – PPO | Admitting: Family Medicine

## 2022-01-02 VITALS — BP 114/73 | HR 83 | Temp 98.3°F | Ht 66.0 in | Wt 142.6 lb

## 2022-01-02 DIAGNOSIS — Z1159 Encounter for screening for other viral diseases: Secondary | ICD-10-CM

## 2022-01-02 DIAGNOSIS — Z0001 Encounter for general adult medical examination with abnormal findings: Secondary | ICD-10-CM | POA: Diagnosis not present

## 2022-01-02 DIAGNOSIS — R140 Abdominal distension (gaseous): Secondary | ICD-10-CM | POA: Insufficient documentation

## 2022-01-02 DIAGNOSIS — E7800 Pure hypercholesterolemia, unspecified: Secondary | ICD-10-CM | POA: Diagnosis not present

## 2022-01-02 DIAGNOSIS — R739 Hyperglycemia, unspecified: Secondary | ICD-10-CM

## 2022-01-02 LAB — COMPREHENSIVE METABOLIC PANEL WITH GFR
ALT: 20 U/L (ref 0–35)
AST: 22 U/L (ref 0–37)
Albumin: 4.2 g/dL (ref 3.5–5.2)
Alkaline Phosphatase: 24 U/L — ABNORMAL LOW (ref 39–117)
BUN: 13 mg/dL (ref 6–23)
CO2: 22 meq/L (ref 19–32)
Calcium: 9.4 mg/dL (ref 8.4–10.5)
Chloride: 104 meq/L (ref 96–112)
Creatinine, Ser: 0.85 mg/dL (ref 0.40–1.20)
GFR: 91.63 mL/min
Glucose, Bld: 82 mg/dL (ref 70–99)
Potassium: 4.8 meq/L (ref 3.5–5.1)
Sodium: 138 meq/L (ref 135–145)
Total Bilirubin: 0.3 mg/dL (ref 0.2–1.2)
Total Protein: 6.9 g/dL (ref 6.0–8.3)

## 2022-01-02 LAB — CBC
HCT: 39.8 % (ref 36.0–46.0)
Hemoglobin: 13.2 g/dL (ref 12.0–15.0)
MCHC: 33.2 g/dL (ref 30.0–36.0)
MCV: 94.7 fl (ref 78.0–100.0)
Platelets: 239 10*3/uL (ref 150.0–400.0)
RBC: 4.2 Mil/uL (ref 3.87–5.11)
RDW: 13 % (ref 11.5–15.5)
WBC: 6.6 10*3/uL (ref 4.0–10.5)

## 2022-01-02 LAB — LIPID PANEL
Cholesterol: 180 mg/dL (ref 0–200)
HDL: 52.7 mg/dL (ref >39.00)
LDL Cholesterol: 116 mg/dL — ABNORMAL HIGH (ref 0–99)
NonHDL: 127.05
Total CHOL/HDL Ratio: 3
Triglycerides: 56 mg/dL (ref 0.0–149.0)
VLDL: 11.2 mg/dL (ref 0.0–40.0)

## 2022-01-02 LAB — HEMOGLOBIN A1C: Hgb A1c MFr Bld: 5.6 % (ref 4.6–6.5)

## 2022-01-02 LAB — TSH: TSH: 5.1 u[IU]/mL (ref 0.35–5.50)

## 2022-01-02 NOTE — Progress Notes (Signed)
Chief Complaint:  Sherri Moreno is a 31 y.o. female who presents today for her annual comprehensive physical exam.    Assessment/Plan:  Chronic Problems Addressed Today: Bloating No red flags.  May have underlying IBS.  We discussed peppermint oil and FODMAP eating plan.  We will check labs today.  If symptoms persist will refer to GI.  We discussed reasons return to care.  Elevated LDL cholesterol level Check labs.  Preventative Healthcare: Check labs.  Up-to-date on vaccines and screenings.  Patient Counseling(The following topics were reviewed and/or handout was given):  -Nutrition: Stressed importance of moderation in sodium/caffeine intake, saturated fat and cholesterol, caloric balance, sufficient intake of fresh fruits, vegetables, and fiber.  -Stressed the importance of regular exercise.   -Substance Abuse: Discussed cessation/primary prevention of tobacco, alcohol, or other drug use; driving or other dangerous activities under the influence; availability of treatment for abuse.   -Injury prevention: Discussed safety belts, safety helmets, smoke detector, smoking near bedding or upholstery.   -Sexuality: Discussed sexually transmitted diseases, partner selection, use of condoms, avoidance of unintended pregnancy and contraceptive alternatives.   -Dental health: Discussed importance of regular tooth brushing, flossing, and dental visits.  -Health maintenance and immunizations reviewed. Please refer to Health maintenance section.  Return to care in 1 year for next preventative visit.     Subjective:  HPI:  She has no acute complaints today.  See A/P for status of chronic conditions.'  She has had continued bloating issues.  This is going on for a few years.  She will frequently get gas pains and sharp stomach pains.  She has tried modifying her diet but has not noticed any specific triggers.  She does not think this is associated with dairy intake.  She does have some moderate  constipation and diarrhea.  She does notice that her abdominal pain gets better after having a bowel movement.  She usually has a bowel movement 3 times a week.  Lifestyle Diet: Balanced. Plenty of fruits and vegetables.  Exercise: Does weight training and cardio.      01/02/2022    7:58 AM  Depression screen PHQ 2/9  Decreased Interest 0  Down, Depressed, Hopeless 0  PHQ - 2 Score 0    Health Maintenance Due  Topic Date Due   Hepatitis C Screening  Never done     ROS: Per HPI, otherwise a complete review of systems was negative.   PMH:  The following were reviewed and entered/updated in epic: History reviewed. No pertinent past medical history. Patient Active Problem List   Diagnosis Date Noted   Bloating 01/02/2022   Elevated LDL cholesterol level 05/02/2020   Past Surgical History:  Procedure Laterality Date   PLACEMENT OF BREAST IMPLANTS      Family History  Problem Relation Age of Onset   Depression Mother    Heart attack Father     Medications- reviewed and updated Current Outpatient Medications  Medication Sig Dispense Refill   loratadine (CLARITIN) 10 MG tablet 1 tablet     Norethindrone Acetate-Ethinyl Estradiol (JUNEL 1.5/30) 1.5-30 MG-MCG tablet Take 1 tablet by mouth daily.     No current facility-administered medications for this visit.    Allergies-reviewed and updated Allergies  Allergen Reactions   Ibuprofen Swelling    Lips swell     Social History   Socioeconomic History   Marital status: Single    Spouse name: Not on file   Number of children: Not on file   Years  of education: Not on file   Highest education level: Not on file  Occupational History   Not on file  Tobacco Use   Smoking status: Never   Smokeless tobacco: Never  Substance and Sexual Activity   Alcohol use: Yes    Comment: social    Drug use: Never   Sexual activity: Yes    Partners: Male    Birth control/protection: Pill  Other Topics Concern   Not on file   Social History Narrative   Going to D[employer] school - [employer].    Social Determinants of Health   Financial Resource Strain: Not on file  Food Insecurity: Not on file  Transportation Needs: Not on file  Physical Activity: Not on file  Stress: Not on file  Social Connections: Not on file        Objective:  Physical Exam: BP 114/73   Pulse 83   Temp 98.3 F (36.8 C)   Ht 5\' 6"  (1.676 Moreno)   Wt 142 lb 9.6 oz (64.7 kg)   LMP 12/16/2021 (Exact Date)   SpO2 99%   BMI 23.02 kg/Moreno   Body mass index is 23.02 kg/Moreno. Wt Readings from Last 3 Encounters:  01/02/22 142 lb 9.6 oz (64.7 kg)  10/18/20 133 lb (60.3 kg)  08/16/20 132 lb 12.8 oz (60.2 kg)   Gen: NAD, resting comfortably HEENT: TMs normal bilaterally. OP clear. No thyromegaly noted.  CV: RRR with no murmurs appreciated Pulm: NWOB, CTAB with no crackles, wheezes, or rhonchi GI: Normal bowel sounds present. Soft, Nontender, Nondistended. MSK: no edema, cyanosis, or clubbing noted Skin: warm, dry Neuro: CN2-12 grossly intact. Strength 5/5 in upper and lower extremities. Reflexes symmetric and intact bilaterally.  Psych: Normal affect and thought content     Sherri Moreno. 08/18/20, MD 01/02/2022 8:27 AM

## 2022-01-02 NOTE — Assessment & Plan Note (Signed)
Check labs 

## 2022-01-02 NOTE — Assessment & Plan Note (Signed)
No red flags.  May have underlying IBS.  We discussed peppermint oil and FODMAP eating plan.  We will check labs today.  If symptoms persist will refer to GI.  We discussed reasons return to care.

## 2022-01-02 NOTE — Patient Instructions (Addendum)
It was very nice to see you today!  We will check blood work today.  Please try taking peppermint oil and looking at the FODMAPs eating plan to see if this helps with your symptoms.  We may need to refer you to GI if your symptoms are still bothersome.  Please continue to work on diet and exercise.  We will see back in year for your next physical.  Come back sooner if needed.  Take care, Dr Jerline Pain  PLEASE NOTE:  If you had any lab tests please let us know if you have not heard back within a few days. You may see your results on mychart before we have a chance to review them but we will give you a call once they are reviewed by [place]. If we ordered any referrals today, please let us know if you have not heard from their office within the next week.   Please try these tips to maintain a healthy lifestyle:  Eat at least 3 REAL meals and 1-2 snacks per day.  Aim for no more than 5 hours between eating.  If you eat breakfast, please do so within one hour of getting up.   Each meal should contain half fruits/vegetables, one quarter protein, and one quarter carbs (no bigger than a computer mouse)  Cut down on sweet beverages. This includes juice, soda, and sweet tea.   Drink at least 1 glass of water with each meal and aim for at least 8 glasses per day  Exercise at least 150 minutes every week.    Preventive Care 14-65 Years Old, Female Preventive care refers to lifestyle choices and visits with your health care provider that can promote health and wellness. Preventive care visits are also called wellness exams. What can I expect for my preventive care visit? Counseling During your preventive care visit, your health care provider may ask about your: Medical history, including: Past medical problems. Family medical history. Pregnancy history. Current health, including: Menstrual cycle. Method of birth control. Emotional well-being. Home life and relationship well-being. Sexual  activity and sexual health. Lifestyle, including: Alcohol, nicotine or tobacco, and drug use. Access to firearms. Diet, exercise, and sleep habits. Work and work Statistician. Sunscreen use. Safety issues such as seatbelt and bike helmet use. Physical exam Your health care provider may check your: Height and weight. These may be used to calculate your BMI (body mass index). BMI is a measurement that tells if you are at a healthy weight. Waist circumference. This measures the distance around your waistline. This measurement also tells if you are at a healthy weight and may help predict your risk of certain diseases, such as type 2 diabetes and high blood pressure. Heart rate and blood pressure. Body temperature. Skin for abnormal spots. What immunizations do I need?  Vaccines are usually given at various ages, according to a schedule. Your health care provider will recommend vaccines for you based on your age, medical history, and lifestyle or other factors, such as travel or where you work. What tests do I need? Screening Your health care provider may recommend screening tests for certain conditions. This may include: Pelvic exam and Pap test. Lipid and cholesterol levels. Diabetes screening. This is done by checking your blood sugar (glucose) after you have not eaten for a while (fasting). Hepatitis B test. Hepatitis C test. HIV (human immunodeficiency virus) test. STI (sexually transmitted infection) testing, if you are at risk. BRCA-related cancer screening. This may be done if you have a  family history of breast, ovarian, tubal, or peritoneal cancers. Talk with your health care provider about your test results, treatment options, and if necessary, the need for more tests. Follow these instructions at home: Eating and drinking  Eat a healthy diet that includes fresh fruits and vegetables, whole grains, lean protein, and low-fat dairy products. Take vitamin and mineral supplements  as recommended by your health care provider. Do not drink alcohol if: Your health care provider tells you not to drink. You are pregnant, may be pregnant, or are planning to become pregnant. If you drink alcohol: Limit how much you have to 0-1 drink a day. Know how much alcohol is in your drink. In the [place] one drink equals one 12 oz bottle of beer (355 mL), one 5 oz glass of wine (148 mL), or one 1 oz glass of hard liquor (44 mL). Lifestyle Brush your teeth every morning and night with fluoride toothpaste. Floss one time each day. Exercise for at least 30 minutes 5 or more days each week. Do not use any products that contain nicotine or tobacco. These products include cigarettes, chewing tobacco, and vaping devices, such as e-cigarettes. If you need help quitting, ask your health care provider. Do not use drugs. If you are sexually active, practice safe sex. Use a condom or other form of protection to prevent STIs. If you do not wish to become pregnant, use a form of birth control. If you plan to become pregnant, see your health care provider for a prepregnancy visit. Find healthy ways to manage stress, such as: Meditation, yoga, or listening to music. Journaling. Talking to a trusted person. Spending time with friends and family. Minimize exposure to UV radiation to reduce your risk of skin cancer. Safety Always wear your seat belt while driving or riding in a vehicle. Do not drive: If you have been drinking alcohol. Do not ride with someone who has been drinking. If you have been using any mind-altering substances or drugs. While texting. When you are tired or distracted. Wear a helmet and other protective equipment during sports activities. If you have firearms in your house, make sure you follow all gun safety procedures. Seek help if you have been physically or sexually abused. What's next? Go to your health care provider once a year for an annual wellness visit. Ask your  health care provider how often you should have your eyes and teeth checked. Stay up to date on all vaccines. This information is not intended to replace advice given to you by your health care provider. Make sure you discuss any questions you have with your health care provider. Document Revised: 11/02/2020 Document Reviewed: 11/02/2020 Elsevier Patient Education  [place] stands for fermentable oligosaccharides, disaccharides, monosaccharides, and polyols. These are sugars that are hard for some people to digest. A low-FODMAP eating plan may help some people who have irritable bowel syndrome (IBS) and certain other bowel (intestinal) diseases to manage their symptoms. This meal plan can be complicated to follow. Work with a diet and nutrition specialist (dietitian) to make a low-FODMAP eating plan that is right for you. A dietitian can help make sure that you get enough nutrition from this diet. What are tips for following this plan? Reading food labels Check labels for hidden FODMAPs such as: High-fructose syrup. Honey. Agave. Natural fruit flavors. Onion or garlic powder. Choose low-FODMAP foods that contain 3-4 grams of fiber per serving. Check food labels for serving sizes.  Eat only one serving at a time to make sure FODMAP levels stay low. Shopping Shop with a list of foods that are recommended on this diet and make a meal plan. Meal planning Follow a low-FODMAP eating plan for up to 6 weeks, or as told by your health care provider or dietitian. To follow the eating plan: Eliminate high-FODMAP foods from your diet completely. Choose only low-FODMAP foods to eat. You will do this for 2-6 weeks. Gradually reintroduce high-FODMAP foods into your diet one at a time. Most people should wait a few days before introducing the next new high-FODMAP food into their meal plan. Your dietitian can recommend how quickly you may reintroduce foods. Keep a  daily record of what and how much you eat and drink. Make note of any symptoms that you have after eating. Review your daily record with a dietitian regularly to identify which foods you can eat and which foods you should avoid. General tips Drink enough fluid each day to keep your urine pale yellow. Avoid processed foods. These often have added sugar and may be high in FODMAPs. Avoid most dairy products, whole grains, and sweeteners. Work with a dietitian to make sure you get enough fiber in your diet. Avoid high FODMAP foods at meals to manage symptoms. Recommended foods Fruits Bananas, oranges, tangerines, lemons, limes, blueberries, raspberries, strawberries, grapes, cantaloupe, honeydew melon, kiwi, papaya, passion fruit, and pineapple. Limited amounts of dried cranberries, banana chips, and shredded coconut. Vegetables Eggplant, zucchini, cucumber, peppers, green beans, bean sprouts, lettuce, arugula, kale, Swiss chard, spinach, collard greens, bok choy, summer squash, potato, and tomato. Limited amounts of corn, carrot, and sweet potato. Green parts of scallions. Grains Gluten-free grains, such as rice, oats, buckwheat, quinoa, corn, polenta, and millet. Gluten-free pasta, bread, or cereal. Rice noodles. Corn tortillas. Meats and other proteins Unseasoned beef, pork, poultry, or fish. Eggs. Berniece Salines. Tofu (firm) and tempeh. Limited amounts of nuts and seeds, such as almonds, walnuts, [place] nuts, pecans, peanuts, nut butters, pumpkin seeds, chia seeds, and sunflower seeds. Dairy Lactose-free milk, yogurt, and kefir. Lactose-free cottage cheese and ice cream. Non-dairy milks, such as almond, coconut, hemp, and rice milk. Non-dairy yogurt. Limited amounts of goat cheese, brie, mozzarella, parmesan, swiss, and other hard cheeses. Fats and oils Butter-free spreads. Vegetable oils, such as olive, canola, and sunflower oil. Seasoning and other foods Artificial sweeteners with names that do not  end in "ol," such as aspartame, saccharine, and stevia. Maple syrup, white table sugar, raw sugar, brown sugar, and molasses. Mayonnaise, soy sauce, and tamari. Fresh basil, coriander, parsley, rosemary, and thyme. Beverages Water and mineral water. Sugar-sweetened soft drinks. Small amounts of orange juice or cranberry juice. Black and green tea. Most dry wines. Coffee. The items listed above may not be a complete list of foods and beverages you can eat. Contact a dietitian for more information. Foods to avoid Fruits Fresh, dried, and juiced forms of apple, pear, watermelon, peach, plum, cherries, apricots, blackberries, boysenberries, figs, nectarines, and mango. Avocado. Vegetables Chicory root, artichoke, asparagus, cabbage, snow peas, Brussels sprouts, broccoli, sugar snap peas, mushrooms, celery, and cauliflower. Onions, garlic, leeks, and the white part of scallions. Grains Wheat, including kamut, durum, and semolina. Barley and bulgur. Couscous. Wheat-based cereals. Wheat noodles, bread, crackers, and pastries. Meats and other proteins Fried or fatty meat. Sausage. Cashews and pistachios. Soybeans, baked beans, black beans, chickpeas, kidney beans, fava beans, navy beans, lentils, black-eyed peas, and split peas. Dairy Milk, yogurt, ice cream, and soft cheese. Cream  and sour cream. Milk-based sauces. Custard. Buttermilk. Soy milk. Seasoning and other foods Any sugar-free gum or candy. Foods that contain artificial sweeteners such as sorbitol, mannitol, isomalt, or xylitol. Foods that contain honey, high-fructose corn syrup, or agave. Bouillon, vegetable stock, beef stock, and chicken stock. Garlic and onion powder. Condiments made with onion, such as hummus, chutney, pickles, relish, salad dressing, and salsa. Tomato paste. Beverages Chicory-based drinks. Coffee substitutes. Chamomile tea. Fennel tea. Sweet or fortified wines such as port or sherry. Diet soft drinks made with isomalt,  mannitol, maltitol, sorbitol, or xylitol. Apple, pear, and mango juice. Juices with high-fructose corn syrup. The items listed above may not be a complete list of foods and beverages you should avoid. Contact a dietitian for more information. Summary FODMAP stands for fermentable oligosaccharides, disaccharides, monosaccharides, and polyols. These are sugars that are hard for some people to digest. A low-FODMAP eating plan is a short-term diet that helps to ease symptoms of certain bowel diseases. The eating plan usually lasts up to 6 weeks. After that, high-FODMAP foods are reintroduced gradually and one at a time. This can help you find out which foods may be causing symptoms. A low-FODMAP eating plan can be complicated. It is best to work with a dietitian who has experience with this type of plan. This information is not intended to replace advice given to you by your health care provider. Make sure you discuss any questions you have with your health care provider. Document Revised: 09/24/2019 Document Reviewed: 09/24/2019 Elsevier Patient Education  [place].

## 2022-01-03 LAB — HEPATITIS C ANTIBODY: Hepatitis C Ab: NONREACTIVE

## 2022-01-05 NOTE — Progress Notes (Signed)
Please inform patient of the following:  Cholesterol is borderline elevated but everything else is stable.  Do not need to make any treatment plan changes at this time.  She should let us know if she does not have any improvement with her bloating symptoms with the strategies we discussed at her office visit.

## 2022-01-08 ENCOUNTER — Encounter: Payer: Self-pay | Admitting: Family

## 2022-01-08 ENCOUNTER — Ambulatory Visit (INDEPENDENT_AMBULATORY_CARE_PROVIDER_SITE_OTHER): Payer: BC Managed Care – PPO | Admitting: Family

## 2022-01-08 VITALS — BP 118/79 | HR 76 | Temp 98.6°F | Ht 66.0 in | Wt 143.6 lb

## 2022-01-08 DIAGNOSIS — L237 Allergic contact dermatitis due to plants, except food: Secondary | ICD-10-CM

## 2022-01-08 MED ORDER — METHYLPREDNISOLONE ACETATE 80 MG/ML IJ SUSP
80.0000 mg | Freq: Once | INTRAMUSCULAR | Status: AC
  Administered 2022-01-08: 80 mg via INTRAMUSCULAR

## 2022-01-08 MED ORDER — TRIAMCINOLONE ACETONIDE 0.1 % EX CREA: 1.0000 | TOPICAL_CREAM | Freq: Two times a day (BID) | CUTANEOUS | 0 refills | Status: DC
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
## 2022-01-12 ENCOUNTER — Encounter: Payer: Self-pay | Admitting: Internal Medicine

## 2022-01-12 ENCOUNTER — Ambulatory Visit (INDEPENDENT_AMBULATORY_CARE_PROVIDER_SITE_OTHER): Payer: BC Managed Care – PPO | Admitting: Internal Medicine

## 2022-01-12 VITALS — BP 106/70 | HR 73 | Temp 98.8°F | Resp 14 | Ht 66.0 in | Wt 142.8 lb

## 2022-01-12 DIAGNOSIS — R21 Rash and other nonspecific skin eruption: Secondary | ICD-10-CM

## 2022-01-12 MED ORDER — PREDNISONE 20 MG PO TABS: ORAL_TABLET | ORAL | 0 refills | Status: DC

## 2022-01-12 NOTE — Progress Notes (Signed)
Routine Medical Office Visit  Patient:  Sherri Moreno      Age: 31 y.o.       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 She presents with a vesicular bullous rash that is extremely itchy and has not responded to prednisone over the past 3-4 days. The rash is widespread, with multiple lesions scattered across her thighs, upper arms, wrists, and abdomen. The rash developed acutely over a few days. The patient appears well and has no neurological, HEENT, respiratory, cardiac, gastrointestinal, musculoskeletal, or dysmorphologic/malformation findings. She is not currently taking any medications. There are no lab abnormalities or imaging data provided in the case summary.   Most Likely Dx:   a. Contact Dermatitis: Given the patient's history of severe poison ivy reactions and her occupation as a physical therapist, it is possible that she has come into contact with an allergen that has caused a severe dermatitis. The widespread, vesicular bullous rash that is extremely itchy is consistent with this diagnosis. The presence of a known contact with an allergen could further suggest this diagnosis.  Expanded DDx:  b. Bullous Pemphigoid: This autoimmune blistering disease could explain the patient's symptoms. Bullous pemphigoid often presents with pruritic, tense blisters on normal or erythematous skin, which could match the patient's presentation. The presence of circulating autoantibodies against hemidesmosomal proteins could further suggest this diagnosis.  c. Scabies: This parasitic infestation could also explain the patient's symptoms. Scabies often presents with intense pruritus and a rash, which can sometimes appear vesicular. The patient's occupation as a physical  therapist could put her at increased risk of this condition due to close physical contact with patients. The presence of mites, eggs, or feces on a skin scraping could further suggest this diagnosis.  Alternative DDx:  d. Dermatitis Herpetiformis: This condition, associated with celiac disease, could explain the patient's symptoms. Dermatitis herpetiformis presents with intensely pruritic vesicles and can be widespread. The presence of IgA deposits in the dermal papillae on biopsy could further suggest this diagnosis.  e. Drug Eruption: Although the patient is not currently on any medications, it is possible that she had a delayed hypersensitivity reaction to a medication she was previously taking. The presence of a recent medication change could further suggest this diagnosis.  f. Pemphigus Vulgaris: This autoimmune blistering disease could explain the patient's symptoms. Pemphigus vulgaris often presents with flaccid blisters and erosions. The presence of circulating autoantibodies against desmoglein could further suggest this diagnosis.  g. Chickenpox (Varicella): Although less likely given the patient's age and occupation (healthcare workers are often vaccinated), chickenpox could present with a pruritic vesicular rash. The presence of a recent exposure to an individual with chickenpox could further suggest this diagnosis.  h. Atopic Dermatitis: This chronic condition often presents with pruritic, eczematous lesions, but can sometimes present with vesicles. The presence of a personal or family history of atopy could further suggest this diagnosis.  i. Herpes Simplex Virus (HSV) Infection: HSV can cause a widespread vesicular rash, although it more commonly presents with localized clusters of vesicles. The presence of a positive HSV PCR or culture could further suggest this diagnosis.   j. Herpes Zoster: Although it typically presents with a unilateral, dermatomal rash, disseminated herpes zoster  could  potentially cause a widespread vesicular rash. The presence of a history of varicella infection or immunosuppression could further suggest this diagnosis.     #Severe Vesicular Bullous Rash   Dx - Skin biopsy for histopathological examination - Direct immunofluorescence (DIF) to detect autoantibodies in bullous pemphigoid and pemphigus vulgaris - Serologic tests for scabies, herpes simplex virus, and varicella-zoster virus - Patch testing for contact dermatitis - Complete blood count (CBC) and comprehensive metabolic panel (CMP) to rule out systemic causes  Tx - Topical corticosteroids for symptomatic relief of itching and inflammation - Oral antihistamines to reduce itching - Emollients to soothe and protect the skin - Avoidance of identified allergens in case of contact dermatitis - Specific treatments for other conditions as indicated by diagnostic results (e.g., antiparasitic medications for scabies, antiviral medications for herpes simplex virus or varicella-zoster virus, dapsone for dermatitis herpetiformis, etc.)       Subjective:   Sherri Moreno is a 31 y.o. female with PMH significant for: History reviewed. No pertinent past medical history.   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 She presents with a widespread, intensely pruritic vesicular bullous rash that has not responded to prednisone over the past few days. The rash is scattered across her thighs, upper arms, wrists, and abdomen. Given her history and occupation, the most likely diagnosis is contact dermatitis, possibly due to exposure to an allergen in her work environment. However, other possibilities cannot be ruled out without further investigation. The differential diagnosis includes contact dermatitis, bullous pemphigoid, scabies, dermatitis herpetiformis, drug eruption, pemphigus vulgaris, chickenpox, atopic dermatitis, herpes simplex virus  infection, and herpes zoster.  Presenting today with: Chief Complaint  Patient presents with   Rash    Poison ivy possibly all over body with new blisters. Was seen on Monday- given steroid injection and used topical cream-gotten worse with no improvement.            Objective:  Physical Exam: BP 106/70 (BP Location: Right Arm, Patient Position: Sitting)   Pulse 73   Temp 98.8 F (37.1 C) (Temporal)   Resp 14   Ht 5\' 6"  (1.676 m)   Wt 142 lb 12.8 oz (64.8 kg)   LMP 12/16/2021 (Exact Date)   SpO2 99%   BMI 23.05 kg/m    Gen: No acute distress, resting comfortably Psych: Normal affect and thought content  Problem specific physical exam findings:  Rash as pictured all over arms/legs, even abd       Results: No results found for any visits on 01/12/22.   Recent Results (from the past 2160 hour(s))  CBC     Status: None   Collection Time: 01/02/22  8:31 AM  Result Value Ref Range   WBC 6.6 4.0 - 10.5 K/uL   RBC 4.20 3.87 - 5.11 Mil/uL   Platelets 239.0 150.0 - 400.0 K/uL   Hemoglobin 13.2 12.0 - 15.0 g/dL   HCT 01/04/22 44.0 - 10.2 %   MCV 94.7 78.0 - 100.0 fl   MCHC 33.2 30.0 - 36.0 g/dL   RDW 72.5 36.6 - 44.0 %  Comprehensive metabolic panel     Status: Abnormal   Collection Time: 01/02/22  8:31 AM  Result Value Ref Range   Sodium 138 135 - 145 mEq/L   Potassium 4.8 3.5 - 5.1 mEq/L   Chloride 104 96 - 112 mEq/L   CO2 22 19 - 32 mEq/L   Glucose, Bld 82 70 - 99 mg/dL  BUN 13 6 - 23 mg/dL   Creatinine, Ser 6.06 0.40 - 1.20 mg/dL   Total Bilirubin 0.3 0.2 - 1.2 mg/dL   Alkaline Phosphatase 24 (L) 39 - 117 U/L   AST 22 0 - 37 U/L   ALT 20 0 - 35 U/L   Total Protein 6.9 6.0 - 8.3 g/dL   Albumin 4.2 3.5 - 5.2 g/dL   GFR 30.16 >01.09 mL/min    Comment: Calculated using the CKD-EPI Creatinine Equation (2021)   Calcium 9.4 8.4 - 10.5 mg/dL  Hemoglobin N2T     Status: None   Collection Time: 01/02/22  8:31 AM  Result Value Ref Range   Hgb A1c MFr Bld 5.6 4.6 -  6.5 %    Comment: Glycemic Control Guidelines for People with Diabetes:Non Diabetic:  <6%Goal of Therapy: <7%Additional Action Suggested:  >8%   TSH     Status: None   Collection Time: 01/02/22  8:31 AM  Result Value Ref Range   TSH 5.10 0.35 - 5.50 uIU/mL  Lipid panel     Status: Abnormal   Collection Time: 01/02/22  8:31 AM  Result Value Ref Range   Cholesterol 180 0 - 200 mg/dL    Comment: ATP III Classification       Desirable:  < 200 mg/dL               Borderline High:  200 - 239 mg/dL          High:  > = 557 mg/dL   Triglycerides 32.2 0.0 - 149.0 mg/dL    Comment: Normal:  <025 mg/dLBorderline High:  150 - 199 mg/dL   HDL 42.70 >62.37 mg/dL   VLDL 62.8 0.0 - 31.5 mg/dL   LDL Cholesterol 176 (H) 0 - 99 mg/dL   Total CHOL/HDL Ratio 3     Comment:                Men          Women1/2 Average Risk     3.4          3.3Average Risk          5.0          4.42X Average Risk          9.6          7.13X Average Risk          15.0          11.0                       NonHDL 127.05     Comment: NOTE:  Non-HDL goal should be 30 mg/dL higher than patient's LDL goal (i.e. LDL goal of < 70 mg/dL, would have non-HDL goal of < 100 mg/dL)  Hepatitis C antibody     Status: None   Collection Time: 01/02/22  8:31 AM  Result Value Ref Range   Hepatitis C Ab NON-REACTIVE NON-REACTIVE    Comment: . HCV antibody was non-reactive. There is no laboratory  evidence of HCV infection. . In most cases, no further action is required. However, if recent HCV exposure is suspected, a test for HCV RNA (test code 16073) is suggested. . For additional information please refer to http://education.quest[facility].com/faq/FAQ22v1 (This link is being provided for informational/ educational purposes only.) .

## 2022-01-12 NOTE — Patient Instructions (Signed)
It was a pleasure seeing you today!  Today the plan is...   After reviewing all of the possible diagnoses using visual DX and glass AII and my clinical judgment I feel that the most likely cause is a contact allergen such as poison ivy that she is having a severe reaction to.  Less likely scabies or impetigo.  I think that the skin biopsy is probably overkill and I recommend just taking triamcinolone Claritin Benadryl and Pepcid and giving it a few days and seeing if it just clears up on its.  If not I recommend calling back and trying treatment for scabies and/or impetigo.  If it gets much worse I recommend coming back in person sooner and we will do a skin biopsy.  I offered to do the lab work including herpes and varicella titers as well as blood count and metabolic panel but I do not really recommend it I feel pretty strongly that this is not chickenpox since she had it before and its not shingles because she does not have dermatomal distribution and blood count and metabolic panel is very unlikely to reveal the cause Bullous rash -     predniSONE; Take 2 pills for 3 days, 1 pill for 4 days  Dispense: 10 tablet; Refill: 0      Lula Olszewski, MD   No follow-ups on file.  - If your condition begins to worsen or become severe:  go to the ER. - If your condition fails to resolve or you have other questions / concerns: please contact me via phone [phone] or MyChart messaging.     I dont think you have bullous pemphigoid but its high on the differential. I prefer scabies or contact dermatitis.  But I wanted to give you info about it.  Bullous Pemphigoid  Bullous pemphigoid is a skin disease that causes blisters to form. It ranges in severity and can last for a long time. The disease can come back months or years after it goes away. Bullous pemphigoid is an autoimmune disease. This means that the body's own disease-fighting system (immune system) attacks the body.  What are the  causes? The cause of this condition is not known. Certain medicines and conditions, such as psoriasis, lichen planus, and multiple sclerosis, have been associated with bullous pemphigoid. What increases the risk? This condition is more likely to develop in people over the age of 24. What are the signs or symptoms? This condition causes blisters to form on the skin. In mild cases, only a few small blisters form. In severe cases, many large blisters form in several areas of the body. The most common places blisters form are the groin, armpits, torso, thighs, and forearms. Some people develop blisters in the mouth. The blisters may break open, forming ulcers. Other symptoms of this condition include: Redness. Irritation. Itchiness. Bleeding gums. Difficulty eating. Cough. Pain with swallowing. Nosebleeds. How is this diagnosed? This condition may be diagnosed with a physical exam and blood tests. You may have a skin sample removed for testing (skin biopsy) to confirm diagnosis. You may work with a health care provider who specializes in skin care (dermatologist). How is this treated? This condition may be managed with medicines, such as: Antibiotic medicines to prevent infection. Steroid medicines to reduce inflammation. These may be applied to the skin (topical), taken by mouth (oral), or given as injections. Medicines that reduce the activity of (suppress) the immune system. If your mouth or lips are affected, your health  care provider may recommend changing your diet while you are having symptoms. If your symptoms are severe, you may need to be treated at the hospital. Treatment at the hospital may include: Treatment for ulcers. IV medicines. IV nutrition, if your mouth or lips are affected. Follow these instructions at home: Take over-the-counter and prescription medicines only as told by your health care provider. Keep your skin clean. Do not scratch, pop, or drain your blisters.  Doing so can lead to infection. Cover blisters or ulcers with clean bandages until they heal. Change the bandages once a day or as often as recommended by your health care provider. If you have blisters or ulcers in your mouth or lips: Try only drinking liquids or only eating soft foods to help relieve discomfort while eating. Avoid drinking very hot liquids. Keep all follow-up visits as told by your health care provider. This is important. Contact a health care provider if you: Have pain or itchiness that does not get better with medicine. Develop redness, swelling, or pain that spreads away from your blisters or ulcers. Have pus coming from a blister or ulcer. Get help right away if you: Have a fever. Become confused. Have severe pain. Feel unusually tired or weak. Cannot eat or drink because of blisters, ulcers, or pain in your lips or mouth. Cannot care for yourself because of blisters, ulcers, or pain in your hands or in the soles of your feet. Summary Bullous pemphigoid is a skin disease that causes blisters to form. This an autoimmune disease, which means that the body's own disease-fighting system (immune system) attacks the body. This condition can be treated with medicines. In some cases, you may need treatment at a hospital. Do not scratch, pop, or drain your blisters. This information is not intended to replace advice given to you by your health care provider. Make sure you discuss any questions you have with your health care provider. Document Revised: 03/01/2020 Document Reviewed: 03/01/2020 Elsevier Patient Education  2023 [employer].

## 2022-07-27 ENCOUNTER — Encounter: Payer: BC Managed Care – PPO | Admitting: Surgical

## 2023-01-03 ENCOUNTER — Encounter (INDEPENDENT_AMBULATORY_CARE_PROVIDER_SITE_OTHER): Payer: Self-pay

## 2023-01-04 ENCOUNTER — Encounter: Payer: BC Managed Care – PPO | Admitting: Family Medicine

## 2023-01-24 ENCOUNTER — Encounter: Payer: Self-pay | Admitting: Family Medicine

## 2023-01-24 ENCOUNTER — Encounter: Payer: BC Managed Care – PPO | Admitting: Family Medicine

## 2023-01-24 ENCOUNTER — Ambulatory Visit (INDEPENDENT_AMBULATORY_CARE_PROVIDER_SITE_OTHER): Payer: BC Managed Care – PPO | Admitting: Family Medicine

## 2023-01-24 VITALS — BP 118/72 | HR 107 | Temp 98.0°F | Ht 66.0 in | Wt 145.4 lb

## 2023-01-24 DIAGNOSIS — E7800 Pure hypercholesterolemia, unspecified: Secondary | ICD-10-CM | POA: Diagnosis not present

## 2023-01-24 DIAGNOSIS — Z0001 Encounter for general adult medical examination with abnormal findings: Secondary | ICD-10-CM | POA: Diagnosis not present

## 2023-01-24 DIAGNOSIS — L309 Dermatitis, unspecified: Secondary | ICD-10-CM

## 2023-01-24 DIAGNOSIS — Z23 Encounter for immunization: Secondary | ICD-10-CM | POA: Diagnosis not present

## 2023-01-24 DIAGNOSIS — Z131 Encounter for screening for diabetes mellitus: Secondary | ICD-10-CM | POA: Diagnosis not present

## 2023-01-24 LAB — LIPID PANEL
Cholesterol: 194 mg/dL (ref 0–200)
HDL: 60.7 mg/dL (ref >39.00)
LDL Cholesterol: 120 mg/dL — ABNORMAL HIGH (ref 0–99)
NonHDL: 133.27
Total CHOL/HDL Ratio: 3
Triglycerides: 67 mg/dL (ref 0.0–149.0)
VLDL: 13.4 mg/dL (ref 0.0–40.0)

## 2023-01-24 LAB — HEMOGLOBIN A1C: Hgb A1c MFr Bld: 5.5 % (ref 4.6–6.5)

## 2023-01-24 LAB — CBC
HCT: 41.1 % (ref 36.0–46.0)
Hemoglobin: 13.5 g/dL (ref 12.0–15.0)
MCHC: 32.9 g/dL (ref 30.0–36.0)
MCV: 95.5 fl (ref 78.0–100.0)
Platelets: 270 10*3/uL (ref 150.0–400.0)
RBC: 4.31 Mil/uL (ref 3.87–5.11)
RDW: 12.8 % (ref 11.5–15.5)
WBC: 6.7 10*3/uL (ref 4.0–10.5)

## 2023-01-24 LAB — TSH: TSH: 3.42 u[IU]/mL (ref 0.35–5.50)

## 2023-01-24 LAB — COMPREHENSIVE METABOLIC PANEL
ALT: 20 U/L (ref 0–35)
AST: 19 U/L (ref 0–37)
Albumin: 4 g/dL (ref 3.5–5.2)
Alkaline Phosphatase: 25 U/L — ABNORMAL LOW (ref 39–117)
BUN: 13 mg/dL (ref 6–23)
CO2: 24 meq/L (ref 19–32)
Calcium: 8.9 mg/dL (ref 8.4–10.5)
Chloride: 106 meq/L (ref 96–112)
Creatinine, Ser: 0.82 mg/dL (ref 0.40–1.20)
GFR: 94.96 mL/min (ref >60.00)
Glucose, Bld: 81 mg/dL (ref 70–99)
Potassium: 4.2 meq/L (ref 3.5–5.1)
Sodium: 137 meq/L (ref 135–145)
Total Bilirubin: 0.4 mg/dL (ref 0.2–1.2)
Total Protein: 7.1 g/dL (ref 6.0–8.3)

## 2023-01-24 NOTE — Addendum Note (Signed)
Addended by: Dyann Kief on: 01/24/2023 11:43 AM   Modules accepted: Orders

## 2023-01-24 NOTE — Patient Instructions (Signed)
It was very nice to see you today!  We will check blood work today.  Keep working on diet and exercise.  We gave your flu shot today.  Return in about 1 year (around 01/24/2024) for Annual Physical.   Take care, Dr Jimmey Ralph  PLEASE NOTE:  If you had any lab tests, please let us know if you have not heard back within a few days. You may see your results on mychart before we have a chance to review them but we will give you a call once they are reviewed by [place].   If we ordered any referrals today, please let us know if you have not heard from their office within the next week.   If you had any urgent prescriptions sent in today, please check with the pharmacy within an hour of our visit to make sure the prescription was transmitted appropriately.   Please try these tips to maintain a healthy lifestyle:  Eat at least 3 REAL meals and 1-2 snacks per day.  Aim for no more than 5 hours between eating.  If you eat breakfast, please do so within one hour of getting up.   Each meal should contain half fruits/vegetables, one quarter protein, and one quarter carbs (no bigger than a computer mouse)  Cut down on sweet beverages. This includes juice, soda, and sweet tea.   Drink at least 1 glass of water with each meal and aim for at least 8 glasses per day  Exercise at least 150 minutes every week.    Preventive Care 32-81 Years Old, Female Preventive care refers to lifestyle choices and visits with your health care provider that can promote health and wellness. Preventive care visits are also called wellness exams. What can I expect for my preventive care visit? Counseling During your preventive care visit, your health care provider may ask about your: Medical history, including: Past medical problems. Family medical history. Pregnancy history. Current health, including: Menstrual cycle. Method of birth control. Emotional well-being. Home life and relationship well-being. Sexual  activity and sexual health. Lifestyle, including: Alcohol, nicotine or tobacco, and drug use. Access to firearms. Diet, exercise, and sleep habits. Work and work Astronomer. Sunscreen use. Safety issues such as seatbelt and bike helmet use. Physical exam Your health care provider may check your: Height and weight. These may be used to calculate your BMI (body mass index). BMI is a measurement that tells if you are at a healthy weight. Waist circumference. This measures the distance around your waistline. This measurement also tells if you are at a healthy weight and may help predict your risk of certain diseases, such as type 2 diabetes and high blood pressure. Heart rate and blood pressure. Body temperature. Skin for abnormal spots. What immunizations do I need?  Vaccines are usually given at various ages, according to a schedule. Your health care provider will recommend vaccines for you based on your age, medical history, and lifestyle or other factors, such as travel or where you work. What tests do I need? Screening Your health care provider may recommend screening tests for certain conditions. This may include: Pelvic exam and Pap test. Lipid and cholesterol levels. Diabetes screening. This is done by checking your blood sugar (glucose) after you have not eaten for a while (fasting). Hepatitis B test. Hepatitis C test. HIV (human immunodeficiency virus) test. STI (sexually transmitted infection) testing, if you are at risk. BRCA-related cancer screening. This may be done if you have a family history of breast,  ovarian, tubal, or peritoneal cancers. Talk with your health care provider about your test results, treatment options, and if necessary, the need for more tests. Follow these instructions at home: Eating and drinking  Eat a healthy diet that includes fresh fruits and vegetables, whole grains, lean protein, and low-fat dairy products. Take vitamin and mineral supplements  as recommended by your health care provider. Do not drink alcohol if: Your health care provider tells you not to drink. You are pregnant, may be pregnant, or are planning to become pregnant. If you drink alcohol: Limit how much you have to 0-1 drink a day. Know how much alcohol is in your drink. In the [place] one drink equals one 12 oz bottle of beer (355 mL), one 5 oz glass of wine (148 mL), or one 1 oz glass of hard liquor (44 mL). Lifestyle Brush your teeth every morning and night with fluoride toothpaste. Floss one time each day. Exercise for at least 30 minutes 5 or more days each week. Do not use any products that contain nicotine or tobacco. These products include cigarettes, chewing tobacco, and vaping devices, such as e-cigarettes. If you need help quitting, ask your health care provider. Do not use drugs. If you are sexually active, practice safe sex. Use a condom or other form of protection to prevent STIs. If you do not wish to become pregnant, use a form of birth control. If you plan to become pregnant, see your health care provider for a prepregnancy visit. Find healthy ways to manage stress, such as: Meditation, yoga, or listening to music. Journaling. Talking to a trusted person. Spending time with friends and family. Minimize exposure to UV radiation to reduce your risk of skin cancer. Safety Always wear your seat belt while driving or riding in a vehicle. Do not drive: If you have been drinking alcohol. Do not ride with someone who has been drinking. If you have been using any mind-altering substances or drugs. While texting. When you are tired or distracted. Wear a helmet and other protective equipment during sports activities. If you have firearms in your house, make sure you follow all gun safety procedures. Seek help if you have been physically or sexually abused. What's next? Go to your health care provider once a year for an annual wellness visit. Ask your  health care provider how often you should have your eyes and teeth checked. Stay up to date on all vaccines. This information is not intended to replace advice given to you by your health care provider. Make sure you discuss any questions you have with your health care provider. Document Revised: 11/02/2020 Document Reviewed: 11/02/2020 Elsevier Patient Education  2024 [employer].

## 2023-01-24 NOTE — Assessment & Plan Note (Signed)
Has issues with her current contact dermatitis specifically to poison ivy.  Has triamcinolone on hand.  Does not need refill today.  She will let us know if she needs refill.

## 2023-01-24 NOTE — Progress Notes (Signed)
Chief Complaint:  Sherri Moreno is a 32 y.o. female who presents today for her annual comprehensive physical exam.    Assessment/Plan:  Chronic Problems Addressed Today: Dermatitis Has issues with her current contact dermatitis specifically to poison ivy.  Has triamcinolone on hand.  Does not need refill today.  She will let us know if she needs refill.  Elevated LDL cholesterol level Discussed lifestyle modifications.  Check lipids.  Preventative Healthcare: Check labs. Flu shot given today. UpToDate on pap.   Patient Counseling(The following topics were reviewed and/or handout was given):  -Nutrition: Stressed importance of moderation in sodium/caffeine intake, saturated fat and cholesterol, caloric balance, sufficient intake of fresh fruits, vegetables, and fiber.  -Stressed the importance of regular exercise.   -Substance Abuse: Discussed cessation/primary prevention of tobacco, alcohol, or other drug use; driving or other dangerous activities under the influence; availability of treatment for abuse.   -Injury prevention: Discussed safety belts, safety helmets, smoke detector, smoking near bedding or upholstery.   -Sexuality: Discussed sexually transmitted diseases, partner selection, use of condoms, avoidance of unintended pregnancy and contraceptive alternatives.   -Dental health: Discussed importance of regular tooth brushing, flossing, and dental visits.  -Health maintenance and immunizations reviewed. Please refer to Health maintenance section.  Return to care in 1 year for next preventative visit.     Subjective:  HPI:  She has no acute complaints today.   Lifestyle Diet: Balanced. Plenty of fruits and vegetables.  Exercise: Goes to gym 3-5 times per week.      01/24/2023    8:13 AM  Depression screen PHQ 2/9  Decreased Interest 0  Down, Depressed, Hopeless 0  PHQ - 2 Score 0    Health Maintenance Due  Topic Date Due   INFLUENZA VACCINE  12/20/2022     ROS:  Per HPI, otherwise a complete review of systems was negative.   PMH:  The following were reviewed and entered/updated in epic: History reviewed. No pertinent past medical history. Patient Active Problem List   Diagnosis Date Noted   Dermatitis 01/24/2023   Bloating 01/02/2022   Elevated LDL cholesterol level 05/02/2020   Past Surgical History:  Procedure Laterality Date   PLACEMENT OF BREAST IMPLANTS      Family History  Problem Relation Age of Onset   Depression Mother    Heart attack Father     Medications- reviewed and updated Current Outpatient Medications  Medication Sig Dispense Refill   loratadine (CLARITIN) 10 MG tablet 1 tablet     Norethindrone Acetate-Ethinyl Estradiol (JUNEL 1.5/30) 1.5-30 MG-MCG tablet Take 1 tablet by mouth daily.     triamcinolone cream (KENALOG) 0.1 % Apply 1 Application topically 2 (two) times daily. 30 g 0   No current facility-administered medications for this visit.    Allergies-reviewed and updated Allergies  Allergen Reactions   Ibuprofen Swelling    Lips swell     Social History   Socioeconomic History   Marital status: Single    Spouse name: Not on file   Number of children: Not on file   Years of education: Not on file   Highest education level: Not on file  Occupational History   Not on file  Tobacco Use   Smoking status: Never   Smokeless tobacco: Never  Substance and Sexual Activity   Alcohol use: Yes    Comment: social    Drug use: Never   Sexual activity: Yes    Partners: Male    Birth control/protection: Pill  Other Topics Concern   Not on file  Social History Narrative   Going to D[employer] school - [employer].    Social Determinants of Health   Financial Resource Strain: Not on file  Food Insecurity: Not on file  Transportation Needs: Not on file  Physical Activity: Not on file  Stress: Not on file  Social Connections: Unknown (09/26/2021)   Received from [facility]   Social Network     Social Network: Not on file        Objective:  Physical Exam: BP 118/72   Pulse (!) 107   Temp 98 F (36.7 C) (Temporal)   Ht 5\' 6"  (1.676 m)   Wt 145 lb 6.4 oz (66 kg)   LMP 01/06/2023   SpO2 100%   BMI 23.47 kg/m   Body mass index is 23.47 kg/m. Wt Readings from Last 3 Encounters:  01/24/23 145 lb 6.4 oz (66 kg)  01/12/22 142 lb 12.8 oz (64.8 kg)  01/08/22 143 lb 9.6 oz (65.1 kg)   Gen: NAD, resting comfortably HEENT: TMs normal bilaterally. OP clear. No thyromegaly noted.  CV: RRR with no murmurs appreciated Pulm: NWOB, CTAB with no crackles, wheezes, or rhonchi GI: Normal bowel sounds present. Soft, Nontender, Nondistended. MSK: no edema, cyanosis, or clubbing noted Skin: warm, dry Neuro: CN2-12 grossly intact. Strength 5/5 in upper and lower extremities. Reflexes symmetric and intact bilaterally.  Psych: Normal affect and thought content     Anjali Manzella M. Jimmey Ralph, MD 01/24/2023 8:41 AM

## 2023-01-24 NOTE — Assessment & Plan Note (Signed)
Discussed lifestyle modifications. Check lipids.  ?

## 2023-01-25 NOTE — Progress Notes (Signed)
Labs are all stable.  Her LDL is slightly elevated but overall stable compared to last year.  She should continue to work on diet and exercise and we can recheck in a year.

## 2023-03-05 IMAGING — CT CT RENAL STONE PROTOCOL
3 of 4 series · 8 of 46 positions shown, 15 images · non-contrast
Comparison: None.

CLINICAL DATA: Left flank pain over the last 3 weeks.

EXAM:
CT ABDOMEN AND PELVIS WITHOUT CONTRAST
TECHNIQUE: Multidetector CT imaging of the abdomen and pelvis was performed
following the standard protocol without IV contrast.

[Series 4: lungs · axial · 0.63mm/px · z∈[+1301,+1361]mm · 4 of 21 slices shown, 9 images]
[im 5/21  soft-tissue]
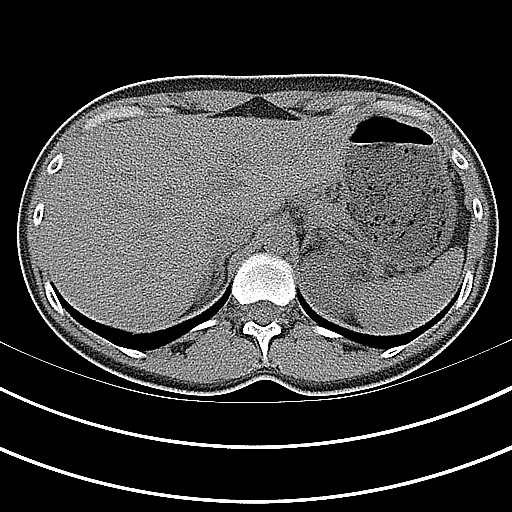
[im 5/21  lung]
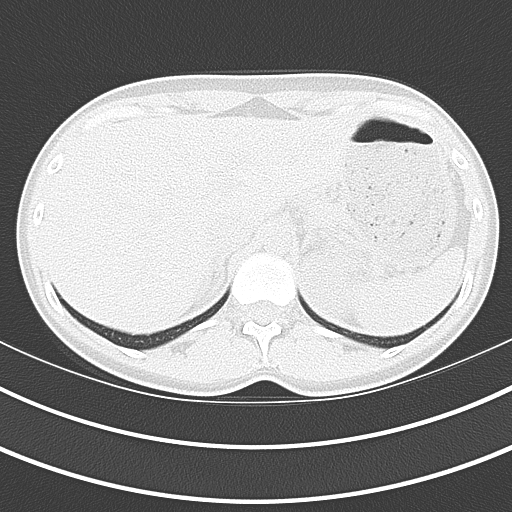
[im 5/21  bone]
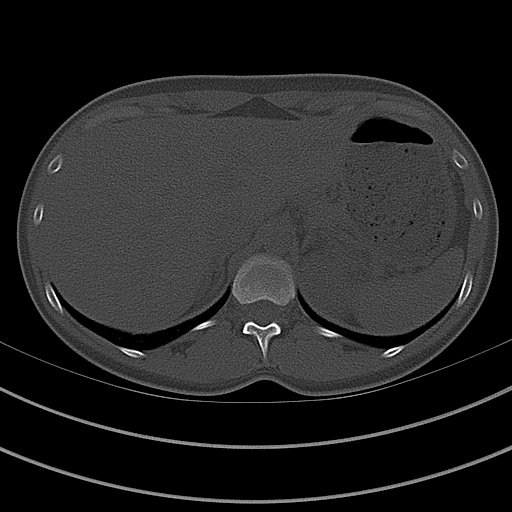
[im 9/21  soft-tissue]
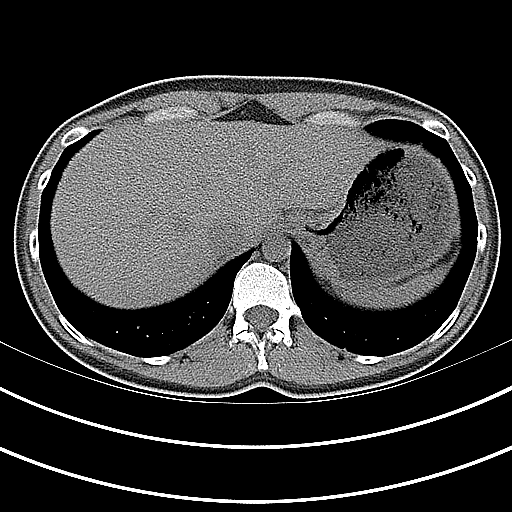
[im 9/21  lung]
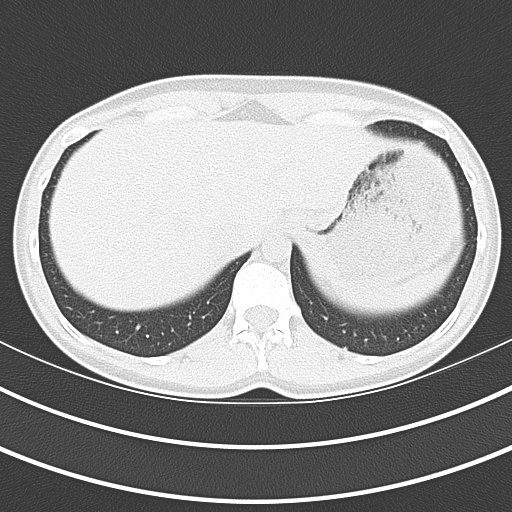
[im 13/21  soft-tissue]
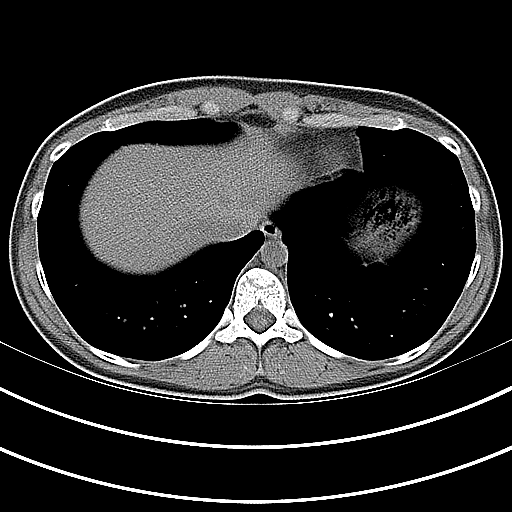
[im 13/21  lung]
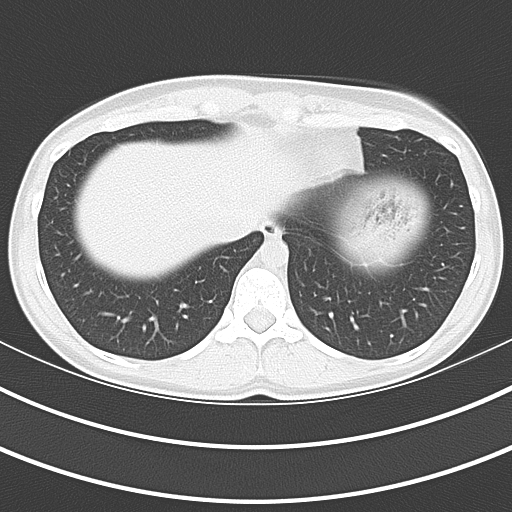
[im 17/21  soft-tissue]
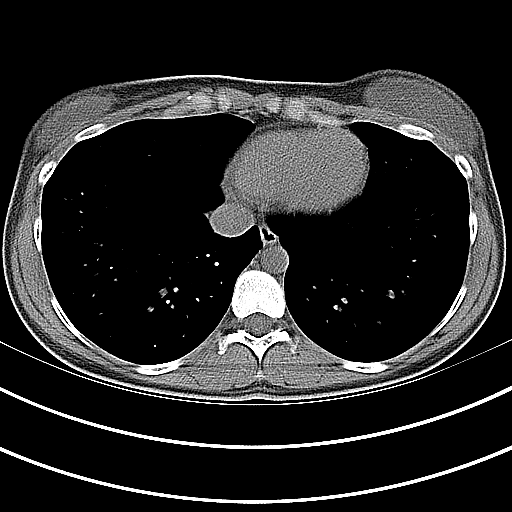
[im 17/21  lung]
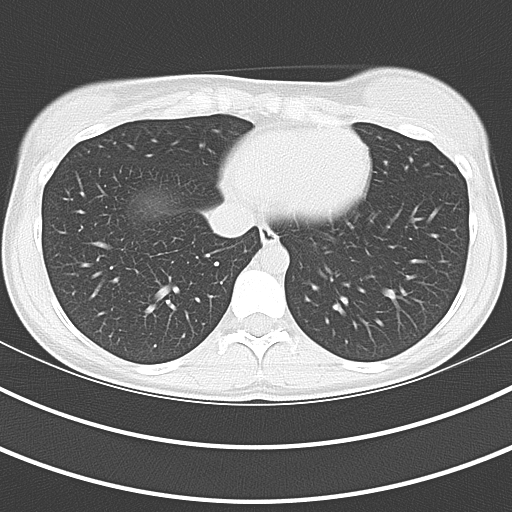

[Series 5: coronal · coronal · 0.73mm/px · 3 of 73 slices shown, 4 images]
[im 25/73  soft-tissue]
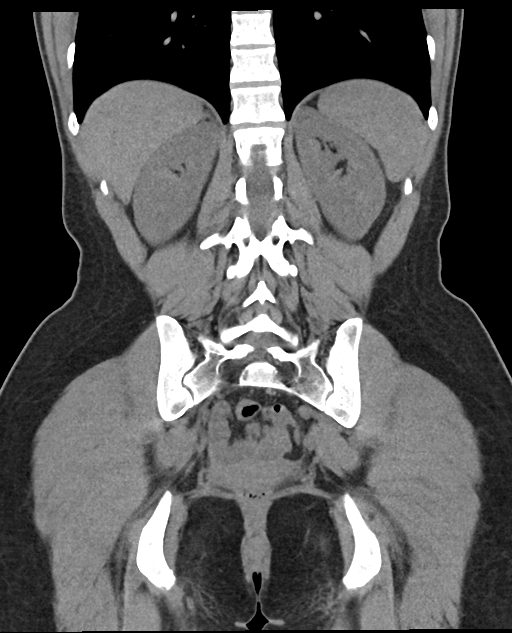
[im 33/73  soft-tissue]
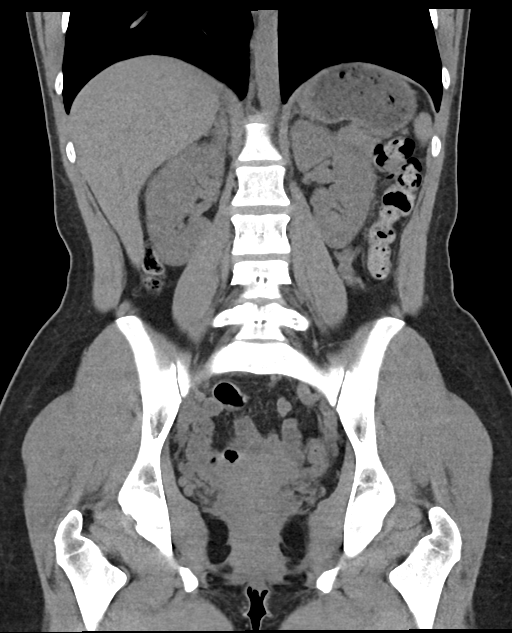
[im 33/73  bone]
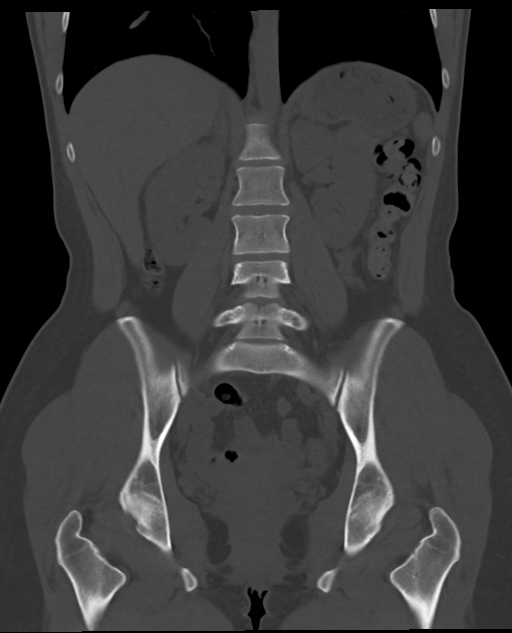
[im 41/73  soft-tissue]
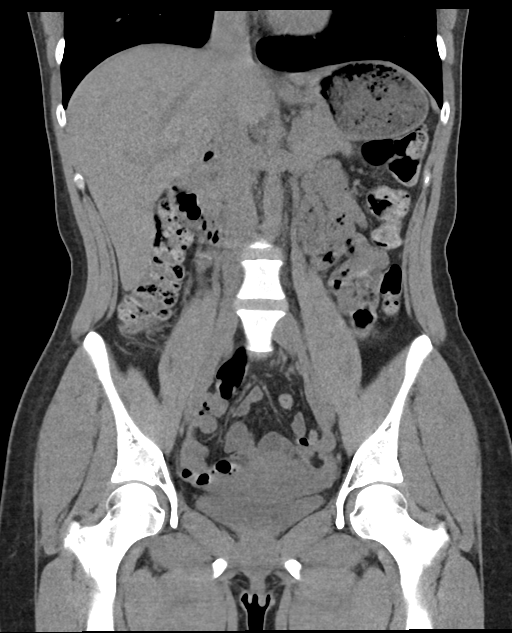

[Series 6: sagittal · sagittal · 0.42mm/px · 1 of 122 slices shown, 2 images]
[im 41/122  soft-tissue]
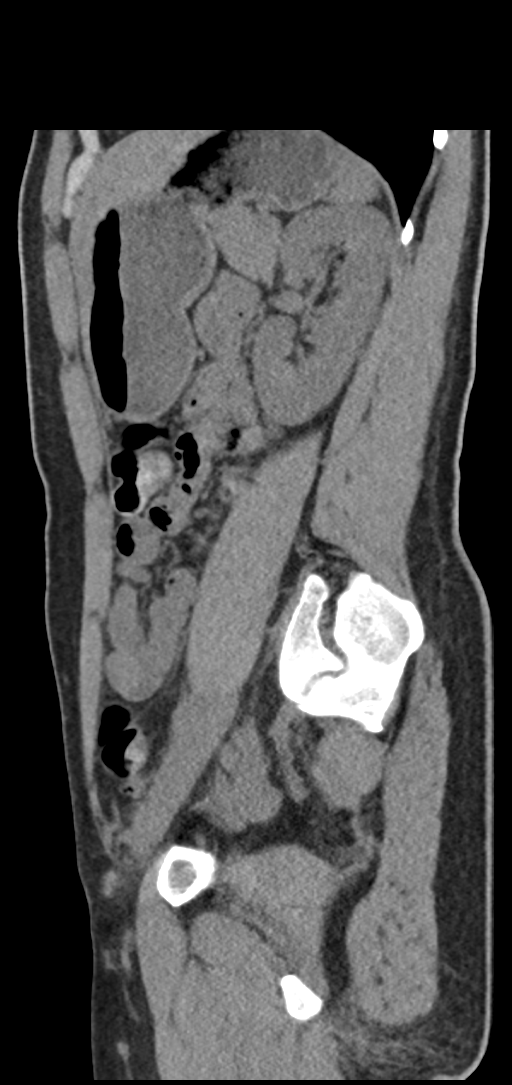
[im 41/122  bone]
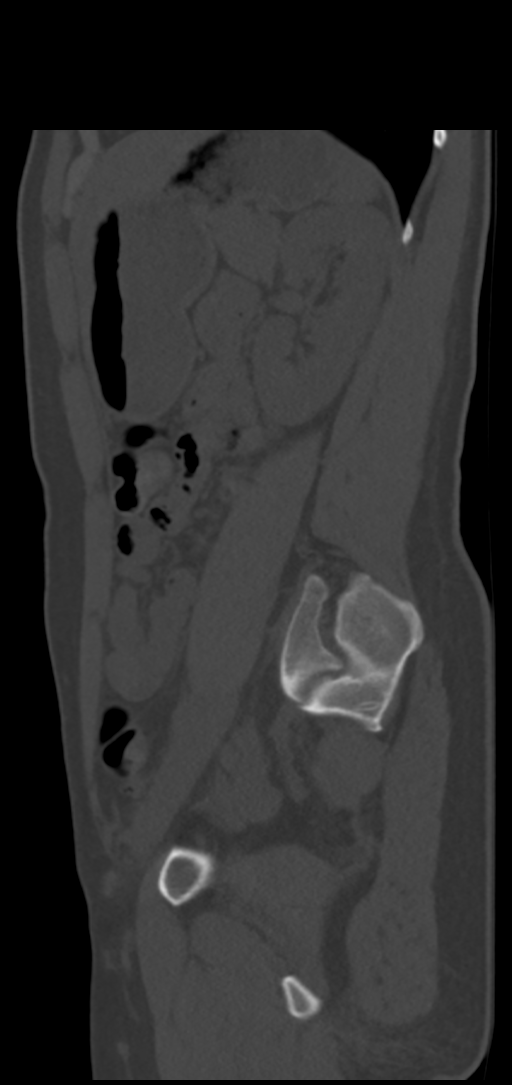

[8 of 46 positions shown; findings below may reference images not displayed]

FINDINGS: Lower chest: Normal

Hepatobiliary: Normal

Pancreas: Normal

Spleen: Normal

Adrenals/Urinary Tract: Adrenal glands are normal. Kidneys are
normal. No cyst, mass, stone or hydronephrosis. Bladder is normal.

Stomach/Bowel: Stomach and small intestine are normal. Colon is
normal.

Vascular/Lymphatic: Aorta and IVC are normal.  No adenopathy.

Reproductive: Uterus and adnexal regions are normal. No pelvic mass.

Other: No free fluid or air.  No hernia.

Musculoskeletal: Normal. No spinal degenerative changes or focal
bone lesions.
IMPRESSION: Normal CT.  No abnormality seen to explain left flank pain.

## 2023-07-15 ENCOUNTER — Ambulatory Visit: Payer: Self-pay | Admitting: Family Medicine

## 2023-07-15 NOTE — Telephone Encounter (Signed)
 Copied from CRM [id number]. Topic: Clinical - Red Word Triage >> Jul 15, 2023 12:02 PM Orinda Kenner C wrote: Red Word that prompted transfer to Nurse Triage: Patient [phone] noticed blood in stool yesterday and today, patient thinks she has IBS, sharp pain, bloating, gassy after eating. Patient denies fever, abdomen pain, fever or shortness of breath.   Chief Complaint: Hematochezia Symptoms: Blood in stools, Flatulence, Abdominal Pain, Bloating. Frequency: Acute, Yesterday and Today Pertinent Negatives: Patient denies dizziness, chest pain, dyspnea, or hematemesis Disposition: [] ED /[] Urgent Care (no appt availability in office) / [x] Appointment(In office/virtual)/ []  [facility]/ [] Home Care/ [] Refused Recommended Disposition /[] [facility]/ []  Follow-up with PCP Additional Notes: Sherri Moreno is being triaged for blood in her stool. The patient reports this is acute in nature, and has a history of abdominal pain, bloating, and flatulence. Discussed severity of symptoms and duration, recommended appointment within three days to see PCP. Patient agreed to disposition and verbalized understanding of plan.   Reason for Disposition  MILD rectal bleeding (more than just a few drops or streaks)  Answer Assessment - Initial Assessment Questions 1. APPEARANCE of BLOOD: "What color is it?" "Is it passed separately, on the surface of the stool, or mixed in with the stool?"      Bright Red  2. AMOUNT: "How much blood was passed?"       Spotty, small amounts, Did not turn the toilet bowl red. Blood on toilet tissue.  3. FREQUENCY: "How many times has blood been passed with the stools?"      2  4. ONSET: "When was the blood first seen in the stools?" (Days or weeks)      Acute  5. DIARRHEA: "Is there also some diarrhea?" If Yes, ask: "How many diarrhea stools in the past 24 hours?"       No  6. CONSTIPATION: "Do you have constipation?" If Yes, ask: "How bad is it?"     No, History of  constipation. Takes Metamucil  7. RECURRENT SYMPTOMS: "Have you had blood in your stools before?" If Yes, ask: "When was the last time?" and "What happened that time?"      No  8. BLOOD THINNERS: "Do you take any blood thinners?" (e.g., Coumadin/warfarin, Pradaxa/dabigatran, aspirin)     No  9. OTHER SYMPTOMS: "Do you have any other symptoms?"  (e.g., abdomen pain, vomiting, dizziness, fever)     Abdominal Pain, Bloating, Flatulence  10. PREGNANCY: "Is there any chance you are pregnant?" "When was your last menstrual period?"       No, First week of this month  Protocols used: Rectal Bleeding-A-AH

## 2023-07-15 NOTE — Telephone Encounter (Signed)
 Noted Patient has OV with PCP on 07/18/2023

## 2023-07-18 ENCOUNTER — Ambulatory Visit: Payer: BC Managed Care – PPO | Admitting: Family Medicine

## 2023-07-18 ENCOUNTER — Encounter: Payer: Self-pay | Admitting: Family Medicine

## 2023-07-18 VITALS — BP 123/74 | HR 98 | Temp 98.2°F | Ht 66.0 in | Wt 143.0 lb

## 2023-07-18 DIAGNOSIS — R140 Abdominal distension (gaseous): Secondary | ICD-10-CM | POA: Diagnosis not present

## 2023-07-18 NOTE — Assessment & Plan Note (Signed)
 This has been going on for a couple of years.  She has associated cramping and intermittent diarrhea as well.  Recently has developed bright red blood per rectum a few days ago which has since resolved.  Concern for underlying IBS given her constellation of symptoms however given her recent bloody stool, she needs GI evaluation at this point.  Will place referral today.  We did discuss peppermint oil and dietary modifications.

## 2023-07-18 NOTE — Progress Notes (Signed)
   Sherri Moreno is a 33 y.o. female who presents today for an office visit.  Assessment/Plan:  New/Acute Problems: Bright red blood per rectum This resolved a couple days ago.  Reassuring exam today.  Stable vital signs.  She has had intermittent cramping and diarrhea for multiple years as below.  Likely does have underlying IBS and may have had a small internal hemorrhoid.  Given her other GI symptoms would be reasonable for her to have a GI evaluation at this point.  Will place referral today.  She will let us know if she has any returning symptoms or any worsening symptoms between now and her visit with GI.  Chronic Problems Addressed Today: Bloating This has been going on for a couple of years.  She has associated cramping and intermittent diarrhea as well.  Recently has developed bright red blood per rectum a few days ago which has since resolved.  Concern for underlying IBS given her constellation of symptoms however given her recent bloody stool, she needs GI evaluation at this point.  Will place referral today.  We did discuss peppermint oil and dietary modifications.     Subjective:  HPI:  See A/P for status of chronic conditions.  Patient is here today with bright red blood per rectum.  Symptoms started 3 to 4 days ago.  She noticed blood surrounding her stools and also blood when wiping.  This persisted for a couple of days however resolved within the last couple of days.  She does have a ongoing history of cramping and bloating for the last few years.  She is concerned that she may have IBS.  She has tried modifying her diet however frequently will have intense cramping and abdominal pain.  Will occasionally have diarrhea as well.  She has never had blood in the stool prior to this.  No fevers or chills.  No nausea or vomiting.  She has not noticed any anal lumps or lesions.  No pain.        Objective:  Physical Exam: BP 123/74   Pulse 98   Temp 98.2 F (36.8 C) (Temporal)    Ht 5\' 6"  (1.676 m)   Wt 143 lb (64.9 kg)   LMP 06/23/2023   SpO2 100%   BMI 23.08 kg/m   Gen: No acute distress, resting comfortably CV: Regular rate and rhythm with no murmurs appreciated Pulm: Normal work of breathing, clear to auscultation bilaterally with no crackles, wheezes, or rhonchi Neuro: Grossly normal, moves all extremities Psych: Normal affect and thought content      Daymond Cordts M. Jimmey Ralph, MD 07/18/2023 8:57 AM

## 2023-07-18 NOTE — Patient Instructions (Signed)
 It was very nice to see you today!  I will refer you to see the gastroenterologist.  Please try the peppermint oil supplement.  Return if symptoms worsen or fail to improve.   Take care, Dr Jimmey Ralph  PLEASE NOTE:  If you had any lab tests, please let us know if you have not heard back within a few days. You may see your results on mychart before we have a chance to review them but we will give you a call once they are reviewed by [place].   If we ordered any referrals today, please let us know if you have not heard from their office within the next week.   If you had any urgent prescriptions sent in today, please check with the pharmacy within an hour of our visit to make sure the prescription was transmitted appropriately.   Please try these tips to maintain a healthy lifestyle:  Eat at least 3 REAL meals and 1-2 snacks per day.  Aim for no more than 5 hours between eating.  If you eat breakfast, please do so within one hour of getting up.   Each meal should contain half fruits/vegetables, one quarter protein, and one quarter carbs (no bigger than a computer mouse)  Cut down on sweet beverages. This includes juice, soda, and sweet tea.   Drink at least 1 glass of water with each meal and aim for at least 8 glasses per day  Exercise at least 150 minutes every week.

## 2023-07-19 NOTE — Telephone Encounter (Signed)
**Note De-identified  [place] Obfuscation** Please advise 

## 2023-07-22 ENCOUNTER — Encounter: Payer: Self-pay | Admitting: Physician Assistant

## 2023-07-23 NOTE — Telephone Encounter (Signed)
 She can try it first for a few weeks to see how it works.  If she does well with that she can continue indefinitely.  She can also discuss further with GI.

## 2023-08-29 NOTE — Progress Notes (Unsigned)
 Sherri Amy, PA-C [address] [place], [place]  [address] Phone: [phone]   Gastroenterology Consultation  Referring Provider:     Ardith Dark, MD Primary Care Physician:  Ardith Dark, MD Primary Gastroenterologist:  Sherri Amy, PA-C / Tiajuana Amass, MD  Reason for Consultation:     Bloating, blood in stool        HPI:   Sherri Moreno is a 33 y.o. y/o female referred for consultation & management  by Ardith Dark, MD.    Patient saw Dr. Jimmey Ralph 07/18/2023 to evaluate bloating and 4-day history of blood in stool.  She noticed bright red blood on the tissue after BM with wiping.  Lasted 3 days and resolved.  She has not had any more episodes of rectal bleeding since February.    She Has had intermittent lower abdominal bloating and bowel irregularities for a few years.  Symptoms were thought secondary to IBS and internal hemorrhoid.  She has not had any nausea, vomiting, fever, chills, rectal pain, or weight loss.  We take Metamucil daily and gets regular exercise which helps regulate her bowel habits.  She typically has bowel movement every 2 or 3 days.  Once every 2 weeks she may have an episode of diarrhea caused by certain food triggers or during her menstrual cycle.  No previous GI evaluation or colonoscopy.  She has NO Family history of colon cancer, IBD, or celiac.  01/2023 labs: Normal CBC, CMP, TSH.  Hemoglobin 13.5.  09/2020 abdominal pelvic CT without contrast (to evaluate left flank pain): No acute abnormality.  Normal colon.  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  History reviewed. No pertinent past medical history.  Past Surgical History:  Procedure Laterality Date   PLACEMENT OF BREAST IMPLANTS      Prior to Admission medications   Medication Sig Start Date End Date Taking? Authorizing Provider  loratadine (CLARITIN) 10 MG tablet 1 tablet    [provider]  Norethindrone Acetate-Ethinyl Estradiol (JUNEL 1.5/30)  1.5-30 MG-MCG tablet Take 1 tablet by mouth daily.    [provider]    Family History  Problem Relation Age of Onset   Depression Mother    Heart attack Father      Social History   Tobacco Use   Smoking status: Never   Smokeless tobacco: Never  Substance Use Topics   Alcohol use: Yes    Comment: social    Drug use: Never    Allergies as of 08/30/2023 - Review Complete 08/30/2023  Allergen Reaction Noted   Ibuprofen Swelling 02/25/2018    Review of Systems:    All systems reviewed and negative except where noted in HPI.   Physical Exam:  BP 100/62   Pulse 73   Ht 5\' 6"  (1.676 m)   Wt 142 lb 9.6 oz (64.7 kg)   BMI 23.02 kg/m  No LMP recorded.  General:   Alert,  Well-developed, well-nourished, pleasant and cooperative in NAD Lungs:  Respirations even and unlabored.  Clear throughout to auscultation.   No wheezes, crackles, or rhonchi. No acute distress. Heart:  Regular rate and rhythm; no murmurs, clicks, rubs, or gallops. Abdomen:  Normal bowel sounds.  No bruits.  Soft, and non-distended without masses, hepatosplenomegaly or hernias noted.  No Tenderness.  No guarding or rebound tenderness.    Rectal: No external hemorrhoids, lesions, or rashes.  No rectal tenderness or masses.  Normal anal sphincter tone. Anoscopy: Moderate  internal hemorrhoids at the 12 o'clock position posterior anus.  Not bleeding.  No pain during exam. Neurologic:  Alert and oriented x3;  grossly normal neurologically. Psych:  Alert and cooperative. Normal mood and affect.  Chaperone present for exam: Sherri Moreno, CMA  Imaging Studies: No results found.  Assessment and Plan:   Sherri Moreno is a 33 y.o. y/o female has been referred for chronic intermittent abdominal bloating, irregular bowel habits, and new onset recent bright red blood in stool.  He had 1 episode of bright red blood in stool for 3 days in February and none since.  Anoscopy shows internal hemorrhoids on exam.   He has symptoms are consistent with irritable bowel syndrome.  Low likelihood of IBD or celiac.  I am ordering lab work, stool test, and giving treatment with follow-up.  If rectal bleeding or GI symptoms worsen, then colonoscopy is the  next step.  Patient agrees with this plan.  1.  Rectal bleeding - Mild, 1 episode, Resolved.  Anoscopy shows Internal Hemorrhoids on exam today.  Lab CBC   2.  Internal Hemorrhoids  Rx Hydrocortisone 25mg  suppository Insert 1 into rectum once daily at bedtime x 12 days, #12, 1 RF.  3.  Irregular Bowel Habits  Lab celiac panel, CRP  Stool test fecal calprotectin  4.  Abdominal bloating   Start OTC Align Probiotic 1 capsule once daily for 30 days.  Low FODMAP diet given and discussed  If rectal bleeding returns or she has worsening GI symptoms, then colonoscopy is the next step.  Patient expressed understanding and agrees with plan.  Follow up in 6 with Sherri Amy, PA-C.  Sherri Amy, PA-C

## 2023-08-30 ENCOUNTER — Other Ambulatory Visit (INDEPENDENT_AMBULATORY_CARE_PROVIDER_SITE_OTHER)

## 2023-08-30 ENCOUNTER — Encounter: Payer: Self-pay | Admitting: Physician Assistant

## 2023-08-30 ENCOUNTER — Ambulatory Visit: Admitting: Physician Assistant

## 2023-08-30 VITALS — BP 100/62 | HR 73 | Ht 66.0 in | Wt 142.6 lb

## 2023-08-30 DIAGNOSIS — K921 Melena: Secondary | ICD-10-CM | POA: Diagnosis not present

## 2023-08-30 DIAGNOSIS — R194 Change in bowel habit: Secondary | ICD-10-CM | POA: Diagnosis not present

## 2023-08-30 DIAGNOSIS — K648 Other hemorrhoids: Secondary | ICD-10-CM | POA: Diagnosis not present

## 2023-08-30 DIAGNOSIS — R140 Abdominal distension (gaseous): Secondary | ICD-10-CM | POA: Diagnosis not present

## 2023-08-30 DIAGNOSIS — R198 Other specified symptoms and signs involving the digestive system and abdomen: Secondary | ICD-10-CM

## 2023-08-30 LAB — CBC WITH DIFFERENTIAL/PLATELET
Basophils Absolute: 0.1 10*3/uL (ref 0.0–0.1)
Basophils Relative: 0.8 % (ref 0.0–3.0)
Eosinophils Absolute: 0.1 10*3/uL (ref 0.0–0.7)
Eosinophils Relative: 1.5 % (ref 0.0–5.0)
HCT: 41.5 % (ref 36.0–46.0)
Hemoglobin: 13.8 g/dL (ref 12.0–15.0)
Lymphocytes Relative: 59.6 % — ABNORMAL HIGH (ref 12.0–46.0)
Lymphs Abs: 3.9 10*3/uL (ref 0.7–4.0)
MCHC: 33.2 g/dL (ref 30.0–36.0)
MCV: 94.6 fl (ref 78.0–100.0)
Monocytes Absolute: 0.6 10*3/uL (ref 0.1–1.0)
Monocytes Relative: 9.2 % (ref 3.0–12.0)
Neutro Abs: 1.9 10*3/uL (ref 1.4–7.7)
Neutrophils Relative %: 28.9 % — ABNORMAL LOW (ref 43.0–77.0)
Platelets: 315 10*3/uL (ref 150.0–400.0)
RBC: 4.38 Mil/uL (ref 3.87–5.11)
RDW: 13.1 % (ref 11.5–15.5)
WBC: 6.6 10*3/uL (ref 4.0–10.5)

## 2023-08-30 LAB — C-REACTIVE PROTEIN: CRP: <1 mg/dL (ref 0.5–20.0)

## 2023-08-30 MED ORDER — HYDROCORTISONE ACETATE 25 MG RE SUPP: 25.0000 mg | Freq: Every evening | RECTAL | 1 refills | Status: DC

## 2023-08-30 NOTE — Patient Instructions (Addendum)
 Your provider has requested that you go to the basement level for lab work before leaving today. Press "B" on the elevator. The lab is located at the first door on the left as you exit the elevator.  For Internal Hemorrhoids: Rx Hydrocortisone Suppositories 25mg  Insert 1 into rectum once daily at bedtime for 12 days, #12, 1 RF.     Take 1 Capsule Once Daily for 30 days. If GI symptoms improve, then OK to continue Align. If GI symptoms do not improve after 30 days, then discontinue.

## 2023-08-31 LAB — TISSUE TRANSGLUTAMINASE ABS,IGG,IGA
(tTG) Ab, IgA: <1 U/mL
(tTG) Ab, IgG: <1 U/mL

## 2023-08-31 LAB — IGA: Immunoglobulin A: 257 mg/dL (ref 47–310)

## 2023-09-01 NOTE — Progress Notes (Signed)
 Agree with the assessment and plan as outlined by Brigitte Canard, PA-C.

## 2023-09-17 ENCOUNTER — Other Ambulatory Visit

## 2023-09-17 DIAGNOSIS — K921 Melena: Secondary | ICD-10-CM

## 2023-09-19 LAB — CALPROTECTIN, FECAL: Calprotectin, Fecal: 11 ug/g (ref 0–120)

## 2023-10-11 ENCOUNTER — Ambulatory Visit: Admitting: Physician Assistant

## 2023-12-08 DIAGNOSIS — J029 Acute pharyngitis, unspecified: Secondary | ICD-10-CM | POA: Diagnosis not present

## 2023-12-08 DIAGNOSIS — Z6823 Body mass index (BMI) 23.0-23.9, adult: Secondary | ICD-10-CM | POA: Diagnosis not present

## 2023-12-08 DIAGNOSIS — H66003 Acute suppurative otitis media without spontaneous rupture of ear drum, bilateral: Secondary | ICD-10-CM | POA: Diagnosis not present

## 2024-01-28 ENCOUNTER — Encounter: Payer: Self-pay | Admitting: Family Medicine

## 2024-01-28 ENCOUNTER — Ambulatory Visit: Payer: BC Managed Care – PPO | Admitting: Family Medicine

## 2024-01-28 VITALS — BP 114/71 | HR 116 | Temp 98.1°F | Ht 66.0 in | Wt 142.2 lb

## 2024-01-28 DIAGNOSIS — Z23 Encounter for immunization: Secondary | ICD-10-CM | POA: Diagnosis not present

## 2024-01-28 DIAGNOSIS — Z131 Encounter for screening for diabetes mellitus: Secondary | ICD-10-CM | POA: Diagnosis not present

## 2024-01-28 DIAGNOSIS — E7800 Pure hypercholesterolemia, unspecified: Secondary | ICD-10-CM

## 2024-01-28 DIAGNOSIS — Z0000 Encounter for general adult medical examination without abnormal findings: Secondary | ICD-10-CM | POA: Diagnosis not present

## 2024-01-28 DIAGNOSIS — R140 Abdominal distension (gaseous): Secondary | ICD-10-CM

## 2024-01-28 DIAGNOSIS — Z0001 Encounter for general adult medical examination with abnormal findings: Secondary | ICD-10-CM

## 2024-01-28 LAB — COMPREHENSIVE METABOLIC PANEL WITH GFR
ALT: 32 U/L (ref 0–35)
AST: 27 U/L (ref 0–37)
Albumin: 4.1 g/dL (ref 3.5–5.2)
Alkaline Phosphatase: 27 U/L — ABNORMAL LOW (ref 39–117)
BUN: 13 mg/dL (ref 6–23)
CO2: 23 meq/L (ref 19–32)
Calcium: 9 mg/dL (ref 8.4–10.5)
Chloride: 107 meq/L (ref 96–112)
Creatinine, Ser: 0.79 mg/dL (ref 0.40–1.20)
GFR: 98.6 mL/min (ref >60.00)
Glucose, Bld: 90 mg/dL (ref 70–99)
Potassium: 4 meq/L (ref 3.5–5.1)
Sodium: 138 meq/L (ref 135–145)
Total Bilirubin: 0.3 mg/dL (ref 0.2–1.2)
Total Protein: 7 g/dL (ref 6.0–8.3)

## 2024-01-28 LAB — CBC
HCT: 39.7 % (ref 36.0–46.0)
Hemoglobin: 13.4 g/dL (ref 12.0–15.0)
MCHC: 33.7 g/dL (ref 30.0–36.0)
MCV: 92.8 fl (ref 78.0–100.0)
Platelets: 263 K/uL (ref 150.0–400.0)
RBC: 4.28 Mil/uL (ref 3.87–5.11)
RDW: 13.2 % (ref 11.5–15.5)
WBC: 6.1 K/uL (ref 4.0–10.5)

## 2024-01-28 LAB — HEMOGLOBIN A1C: Hgb A1c MFr Bld: 5.7 % (ref 4.6–6.5)

## 2024-01-28 LAB — LIPID PANEL
Cholesterol: 188 mg/dL (ref 0–200)
HDL: 54.2 mg/dL (ref >39.00)
LDL Cholesterol: 123 mg/dL — ABNORMAL HIGH (ref 0–99)
NonHDL: 133.84
Total CHOL/HDL Ratio: 3
Triglycerides: 55 mg/dL (ref 0.0–149.0)
VLDL: 11 mg/dL (ref 0.0–40.0)

## 2024-01-28 LAB — TSH: TSH: 5.63 u[IU]/mL — ABNORMAL HIGH (ref 0.35–5.50)

## 2024-01-28 NOTE — Assessment & Plan Note (Signed)
 She has seen GI since our last visit.  Workup today was negative.  May have underlying IBS.  She is now taking align probiotics with modest improvement.  Peppermint oil also seems to help.

## 2024-01-28 NOTE — Patient Instructions (Signed)
 It was very nice to see you today!  VISIT SUMMARY: Today, you came in for blood work and a follow-up on your digestive issues. We discussed your symptoms of bloating, cramping, and intermittent diarrhea, and reviewed your current treatments and lifestyle habits.  YOUR PLAN: ADULT WELLNESS VISIT: Routine wellness visit shows stable weight, healthy diet, and regular exercise. Gynecological screenings are current. -We will perform blood work and contact you with the results. -You received a flu shot today. -Please schedule a follow-up in one year unless any issues arise.  ABDOMINAL BLOATING AND CRAMPING WITH INTERMITTENT DIARRHEA: Align Probiotic and peppermint tea provide symptom relief. -Continue taking Align Probiotic. -Use peppermint tea or peppermint oil as needed for symptom relief.  Return in about 1 year (around 01/27/2025).   Take care, Dr Kennyth  PLEASE NOTE:  If you had any lab tests, please let us  know if you have not heard back within a few days. You may see your results on mychart before we have a chance to review them but we will give you a call once they are reviewed by us .   If we ordered any referrals today, please let us  know if you have not heard from their office within the next week.   If you had any urgent prescriptions sent in today, please check with the pharmacy within an hour of our visit to make sure the prescription was transmitted appropriately.   Please try these tips to maintain a healthy lifestyle:  Eat at least 3 REAL meals and 1-2 snacks per day.  Aim for no more than 5 hours between eating.  If you eat breakfast, please do so within one hour of getting up.   Each meal should contain half fruits/vegetables, one quarter protein, and one quarter carbs (no bigger than a computer mouse)  Cut down on sweet beverages. This includes juice, soda, and sweet tea.   Drink at least 1 glass of water with each meal and aim for at least 8 glasses per day  Exercise  at least 150 minutes every week.    Preventive Care 57-14 Years Old, Female Preventive care refers to lifestyle choices and visits with your health care provider that can promote health and wellness. Preventive care visits are also called wellness exams. What can I expect for my preventive care visit? Counseling During your preventive care visit, your health care provider may ask about your: Medical history, including: Past medical problems. Family medical history. Pregnancy history. Current health, including: Menstrual cycle. Method of birth control. Emotional well-being. Home life and relationship well-being. Sexual activity and sexual health. Lifestyle, including: Alcohol, nicotine or tobacco, and drug use. Access to firearms. Diet, exercise, and sleep habits. Work and work Astronomer. Sunscreen use. Safety issues such as seatbelt and bike helmet use. Physical exam Your health care provider may check your: Height and weight. These may be used to calculate your BMI (body mass index). BMI is a measurement that tells if you are at a healthy weight. Waist circumference. This measures the distance around your waistline. This measurement also tells if you are at a healthy weight and may help predict your risk of certain diseases, such as type 2 diabetes and high blood pressure. Heart rate and blood pressure. Body temperature. Skin for abnormal spots. What immunizations do I need?  Vaccines are usually given at various ages, according to a schedule. Your health care provider will recommend vaccines for you based on your age, medical history, and lifestyle or other factors, such  as travel or where you work. What tests do I need? Screening Your health care provider may recommend screening tests for certain conditions. This may include: Pelvic exam and Pap test. Lipid and cholesterol levels. Diabetes screening. This is done by checking your blood sugar (glucose) after you have not  eaten for a while (fasting). Hepatitis B test. Hepatitis C test. HIV (human immunodeficiency virus) test. STI (sexually transmitted infection) testing, if you are at risk. BRCA-related cancer screening. This may be done if you have a family history of breast, ovarian, tubal, or peritoneal cancers. Talk with your health care provider about your test results, treatment options, and if necessary, the need for more tests. Follow these instructions at home: Eating and drinking  Eat a healthy diet that includes fresh fruits and vegetables, whole grains, lean protein, and low-fat dairy products. Take vitamin and mineral supplements as recommended by your health care provider. Do not drink alcohol if: Your health care provider tells you not to drink. You are pregnant, may be pregnant, or are planning to become pregnant. If you drink alcohol: Limit how much you have to 0-1 drink a day. Know how much alcohol is in your drink. In the [place] one drink equals one 12 oz bottle of beer (355 mL), one 5 oz glass of wine (148 mL), or one 1 oz glass of hard liquor (44 mL). Lifestyle Brush your teeth every morning and night with fluoride toothpaste. Floss one time each day. Exercise for at least 30 minutes 5 or more days each week. Do not use any products that contain nicotine or tobacco. These products include cigarettes, chewing tobacco, and vaping devices, such as e-cigarettes. If you need help quitting, ask your health care provider. Do not use drugs. If you are sexually active, practice safe sex. Use a condom or other form of protection to prevent STIs. If you do not wish to become pregnant, use a form of birth control. If you plan to become pregnant, see your health care provider for a prepregnancy visit. Find healthy ways to manage stress, such as: Meditation, yoga, or listening to music. Journaling. Talking to a trusted person. Spending time with friends and family. Minimize exposure to UV  radiation to reduce your risk of skin cancer. Safety Always wear your seat belt while driving or riding in a vehicle. Do not drive: If you have been drinking alcohol. Do not ride with someone who has been drinking. If you have been using any mind-altering substances or drugs. While texting. When you are tired or distracted. Wear a helmet and other protective equipment during sports activities. If you have firearms in your house, make sure you follow all gun safety procedures. Seek help if you have been physically or sexually abused. What's next? Go to your health care provider once a year for an annual wellness visit. Ask your health care provider how often you should have your eyes and teeth checked. Stay up to date on all vaccines. This information is not intended to replace advice given to you by your health care provider. Make sure you discuss any questions you have with your health care provider. Document Revised: 11/02/2020 Document Reviewed: 11/02/2020 Elsevier Patient Education  2024 [employer].

## 2024-01-28 NOTE — Progress Notes (Signed)
 Chief Complaint:  Sherri Moreno is a 33 y.o. female who presents today for her annual comprehensive physical exam.    Assessment/Plan:  Chronic Problems Addressed Today: Elevated LDL cholesterol level Check lipids.   Bloating She has seen GI since our last visit.  Workup today was negative.  May have underlying IBS.  She is now taking align probiotics with modest improvement.  Peppermint oil also seems to help.  Preventative Healthcare: Check labs.  Flu vaccine given today.  Up-to-date on Pap per gynecology.  Patient Counseling(The following topics were reviewed and/or handout was given):  -Nutrition: Stressed importance of moderation in sodium/caffeine intake, saturated fat and cholesterol, caloric balance, sufficient intake of fresh fruits, vegetables, and fiber.  -Stressed the importance of regular exercise.   -Substance Abuse: Discussed cessation/primary prevention of tobacco, alcohol, or other drug use; driving or other dangerous activities under the influence; availability of treatment for abuse.   -Injury prevention: Discussed safety belts, safety helmets, smoke detector, smoking near bedding or upholstery.   -Sexuality: Discussed sexually transmitted diseases, partner selection, use of condoms, avoidance of unintended pregnancy and contraceptive alternatives.   -Dental health: Discussed importance of regular tooth brushing, flossing, and dental visits.  -Health maintenance and immunizations reviewed. Please refer to Health maintenance section.  Return to care in 1 year for next preventative visit.     Subjective:  HPI:  She has no acute complaints today. Patient is here today for her  annual physical.  See assessment / plan for status of chronic conditions.  Discussed the use of AI scribe software for clinical note transcription with the patient, who gave verbal consent to proceed.  History of Present Illness Sherri Moreno is a 33 year old female who presents for blood  work and follow-up on digestive issues.  She experiences bloating with cramping and intermittent diarrhea. A gastroenterologist conducted various tests, all of which returned normal results, and told her that everything was fine. She uses Members Oneil version of Align Probiotic, which provides some relief.  She finds peppermint tea helpful for her digestive symptoms.  She maintains a regular exercise routine, going to the gym at least three times a week, and tries to include plenty of fruits and vegetables in her diet.    Lifestyle Diet: Balanced. Plenty of fruits and vegetables.  Exercise: Going to gym 3 times per week.      01/28/2024    8:06 AM  Depression screen PHQ 2/9  Decreased Interest 0  Down, Depressed, Hopeless 0  PHQ - 2 Score 0    Health Maintenance Due  Topic Date Due   Influenza Vaccine  12/20/2023     ROS: Per HPI, otherwise a complete review of systems was negative.   PMH:  The following were reviewed and entered/updated in epic: History reviewed. No pertinent past medical history. Patient Active Problem List   Diagnosis Date Noted   Dermatitis 01/24/2023   Bloating 01/02/2022   Elevated LDL cholesterol level 05/02/2020   Past Surgical History:  Procedure Laterality Date   PLACEMENT OF BREAST IMPLANTS      Family History  Problem Relation Age of Onset   Depression Mother    Heart attack Father     Medications- reviewed and updated Current Outpatient Medications  Medication Sig Dispense Refill   loratadine (CLARITIN) 10 MG tablet 1 tablet     Norethindrone Acetate-Ethinyl Estradiol (JUNEL 1.5/30) 1.5-30 MG-MCG tablet Take 1 tablet by mouth daily.     psyllium (HYDROCIL/METAMUCIL) 95 % PACK  Take 1 packet by mouth daily.     No current facility-administered medications for this visit.    Allergies-reviewed and updated Allergies  Allergen Reactions   Ibuprofen Swelling    Lips swell     Social History   Socioeconomic History   Marital  status: Single    Spouse name: Not on file   Number of children: Not on file   Years of education: Not on file   Highest education level: Doctorate  Occupational History   Not on file  Tobacco Use   Smoking status: Never   Smokeless tobacco: Never  Substance and Sexual Activity   Alcohol use: Yes    Comment: social    Drug use: Never   Sexual activity: Yes    Partners: Male    Birth control/protection: Pill  Other Topics Concern   Not on file  Social History Narrative   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    Social Drivers of Corporate investment banker Strain: Low Risk  (07/16/2023)   Overall Financial Resource Strain (CARDIA)    Difficulty of Paying Living Expenses: Not very hard  Food Insecurity: No Food Insecurity (07/16/2023)   Hunger Vital Sign    Worried About Running Out of Food in the Last Year: Never true    Ran Out of Food in the Last Year: Never true  Transportation Needs: No Transportation Needs (07/16/2023)   PRAPARE - Administrator, Civil Service (Medical): No    Lack of Transportation (Non-Medical): No  Physical Activity: Sufficiently Active (07/16/2023)   Exercise Vital Sign    Days of Exercise per Week: 4 days    Minutes of Exercise per Session: 40 min  Stress: No Stress Concern Present (07/16/2023)   [employer] of Occupational Health - Occupational Stress Questionnaire    Feeling of Stress : Only a little  Social Connections: Socially Isolated (07/16/2023)   Social Connection and Isolation Panel    Frequency of Communication with Friends and Family: Twice a week    Frequency of Social Gatherings with Friends and Family: More than three times a week    Attends Religious Services: Never    Database administrator or Organizations: No    Attends Engineer, structural: Not on file    Marital Status: Never married        Objective:  Physical Exam: BP 114/71   Pulse (!) 116   Temp 98.1 F (36.7 C) (Temporal)   Ht 5' 6 (1.676 m)    Wt 142 lb 3.2 oz (64.5 kg)   LMP 01/18/2024   SpO2 100%   BMI 22.95 kg/m   Body mass index is 22.95 kg/m. Wt Readings from Last 3 Encounters:  01/28/24 142 lb 3.2 oz (64.5 kg)  08/30/23 142 lb 9.6 oz (64.7 kg)  07/18/23 143 lb (64.9 kg)   Gen: NAD, resting comfortably HEENT: TMs normal bilaterally. OP clear. No thyromegaly noted.  CV: RRR with no murmurs appreciated Pulm: NWOB, CTAB with no crackles, wheezes, or rhonchi GI: Normal bowel sounds present. Soft, Nontender, Nondistended. MSK: no edema, cyanosis, or clubbing noted Skin: warm, dry Neuro: CN2-12 grossly intact. Strength 5/5 in upper and lower extremities. Reflexes symmetric and intact bilaterally.  Psych: Normal affect and thought content     Sherri Madera M. Kennyth, MD 01/28/2024 8:32 AM

## 2024-01-28 NOTE — Assessment & Plan Note (Signed)
 Check lipids

## 2024-01-30 ENCOUNTER — Ambulatory Visit: Payer: Self-pay | Admitting: Family Medicine

## 2024-01-30 DIAGNOSIS — E038 Other specified hypothyroidism: Secondary | ICD-10-CM

## 2024-01-30 NOTE — Progress Notes (Signed)
 Her thyroid  level is off a little bit.  Recommend she come back to recheck TSH, free T4, and free T3.  Cholesterol and blood sugar are borderline but stable.  She should continue to work on diet and exercise and we can recheck again in a year or so.  Everything else is at goal.

## 2024-02-04 ENCOUNTER — Other Ambulatory Visit (INDEPENDENT_AMBULATORY_CARE_PROVIDER_SITE_OTHER)

## 2024-02-04 DIAGNOSIS — E038 Other specified hypothyroidism: Secondary | ICD-10-CM | POA: Diagnosis not present

## 2024-02-04 LAB — T3, FREE: T3, Free: 3.5 pg/mL (ref 2.3–4.2)

## 2024-02-04 LAB — T4, FREE: Free T4: 0.79 ng/dL (ref 0.60–1.60)

## 2024-02-04 LAB — TSH: TSH: 3.74 u[IU]/mL (ref 0.35–5.50)

## 2024-02-05 ENCOUNTER — Ambulatory Visit: Payer: Self-pay | Admitting: Family Medicine

## 2024-02-05 NOTE — Progress Notes (Signed)
 Thyroid  levels are all back to normal.  Do not need to do any further testing at this point.  We should recheck again in a year though she should let us  know if she develops any signs or symptoms of low thyroid  such as fatigue, weight gain, dry skin, constipation, etc.

## 2024-03-02 ENCOUNTER — Ambulatory Visit: Payer: Self-pay

## 2024-03-02 NOTE — Telephone Encounter (Signed)
 FYI Only or Action Required?: FYI only for provider.  Patient was last seen in primary care on 01/28/2024 by Kennyth Worth HERO, MD.  Called Nurse Triage reporting Shoulder Pain.  Symptoms began a week ago.  Interventions attempted: Nothing.  Symptoms are: unchanged.  Triage Disposition: See PCP Within 2 Weeks  Patient/caregiver understands and will follow disposition?: Yes  **Appt. Scheduled for 10/16**       Copied from CRM #[id number]. Topic: Clinical - Red Word Triage >> Mar 02, 2024  4:18 PM Amy B wrote: Red Word that prompted transfer to Nurse Triage: Right shoulder pain, top of shoulder feels more prominent than left. Reason for Disposition  [1] MILD pain (e.g., does not interfere with normal activities) AND [2] present > 7 days  Answer Assessment - Initial Assessment Questions 1. ONSET: When did the pain start?     X 1 week  2. LOCATION: Where is the pain located?     Right shoulder  3. PAIN: How bad is the pain? (Scale 1-10; or mild, moderate, severe)     3/10  4. WORK OR EXERCISE: Has there been any recent work or exercise that involved this part of the body?      Patient attends the gym, at work she is a physical therapist   5. CAUSE: What do you think is causing the shoulder pain?     Unsure   6. OTHER SYMPTOMS: Do you have any other symptoms? (e.g., neck pain, swelling, rash, fever, numbness, weakness)      Weakness in right shoulder   7. PREGNANCY: Is there any chance you are pregnant? When was your last menstrual period?     No.  For home care patient has not taken any medications. Appt. Scheduled for 10/16 to follow up with PCP.  Protocols used: Shoulder Pain-A-AH

## 2024-03-05 ENCOUNTER — Ambulatory Visit (HOSPITAL_BASED_OUTPATIENT_CLINIC_OR_DEPARTMENT_OTHER)
Admission: RE | Admit: 2024-03-05 | Discharge: 2024-03-05 | Disposition: A | Discharge status: Home / Self Care | Source: Ambulatory Visit | Attending: Family Medicine | Admitting: Family Medicine

## 2024-03-05 ENCOUNTER — Encounter: Payer: Self-pay | Admitting: Family Medicine

## 2024-03-05 ENCOUNTER — Ambulatory Visit: Admitting: Family Medicine

## 2024-03-05 VITALS — BP 102/64 | HR 91 | Temp 98.2°F | Ht 66.0 in | Wt 143.0 lb

## 2024-03-05 DIAGNOSIS — M25511 Pain in right shoulder: Secondary | ICD-10-CM

## 2024-03-05 NOTE — Progress Notes (Signed)
 Sherri Moreno is a 33 y.o. female who presents today for an office visit.  Assessment/Plan:  Right Shoulder Pain  Overall reassuring exam.  She does have prominent right AC joint and some mechanical symptoms though no overt signs of rotator cuff pathology or ligamentous injury.  Possible she may be having ongoing chronic subluxation.  Will check plain film today and refer to physical therapy.  She will let us  know if not improving and would consider referral to sports medicine orthopedics in the next couple of weeks.  We did discuss trial of NSAIDs however she declined.  It is okay for use over-the-counter analgesics as needed.      Subjective:  HPI:  See assessment / plan for status of chronic conditions.   Patient here today with right shoulder pain.  Symptoms started within the last week or so.  No obvious injuries or precipitating events.  She has been working out at [employer] but is not sure if she injured it while working out.  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  Pain is predominately located in the lateral aspect of her right shoulder.  She has also noted a prominent right AC joint as well.  She has been working on some stretches at home.  Symptoms seem to be worsened with certain motions such as reaching in front of her.  She does feel her entire shoulder is fatigued towards the end of the day.  Occasionally gets a popping sensation in her arm.  Discussed the use of AI scribe software for clinical note transcription with the patient, who gave verbal consent to proceed.  History of Present Illness Sherri Moreno is a 33 year old female who presents with right shoulder pain and asymmetry.  She has been experiencing intermittent right shoulder pain, primarily occurring with certain motions, particularly when reaching above shoulder height or across her body. The pain is located in the deltoid area and sometimes radiates down to her elbow.  She also experiences a feeling of weakness in her arm, although her range of motion is not affected. At the end of the day, her arm feels fatigued with a burning sensation in the muscles around the shoulder.  She noticed that her right acromion is more prominent than the left, which she first observed about a week ago. She is concerned about this asymmetry and has not tried any treatments for the pain. She denies any direct trauma or injury to the shoulder but mentions that she did some wakeboarding over the summer and experienced soreness after doing planks, which resolved after she stopped the activity.  No numbness, tingling, or pain in the scapula. She occasionally feels a popping sensation in the shoulder but no pain in the scapula or back. 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 She has access to free physical therapy services through her work. She has not taken any anti-inflammatory medications as the pain is not severe enough to warrant it. She has been doing some stretching exercises on her own.          Objective:  Physical Exam: BP 102/64   Pulse 91   Temp 98.2 F (36.8 C) (Temporal)   Ht 5' 6 (1.676 m)   Wt 143 lb (64.9 kg)   SpO2 100%   BMI 23.08 kg/m   Gen: No acute distress, resting comfortably MUSCULOSKELETAL: - Right Arm: No deformities.  Slightly prominent right AC joint noted.  Nontender to palpation.  Biceps tendons without pain on palpation.  Normal supraspinatus testing.  Normal strength with internal and external rotation.  Crepitus noted with Neer test.  Normal Hawking test. Neuro: Grossly normal, moves all extremities Psych: Normal affect and thought content      Amit Meloy M. Kennyth, MD 03/05/2024 8:17 AM

## 2024-03-05 NOTE — Patient Instructions (Signed)
 It was very nice to see you today!  We will get an x-ray of the shoulder and refer you to see physical therapist.  Let us  know if not improving in the next few weeks.  Return if symptoms worsen or fail to improve.   Take care, Dr Kennyth  PLEASE NOTE:  If you had any lab tests, please let us  know if you have not heard back within a few days. You may see your results on mychart before we have a chance to review them but we will give you a call once they are reviewed by us .   If we ordered any referrals today, please let us  know if you have not heard from their office within the next week.   If you had any urgent prescriptions sent in today, please check with the pharmacy within an hour of our visit to make sure the prescription was transmitted appropriately.   Please try these tips to maintain a healthy lifestyle:  Eat at least 3 REAL meals and 1-2 snacks per day.  Aim for no more than 5 hours between eating.  If you eat breakfast, please do so within one hour of getting up.   Each meal should contain half fruits/vegetables, one quarter protein, and one quarter carbs (no bigger than a computer mouse)  Cut down on sweet beverages. This includes juice, soda, and sweet tea.   Drink at least 1 glass of water with each meal and aim for at least 8 glasses per day  Exercise at least 150 minutes every week.

## 2024-03-09 ENCOUNTER — Encounter: Payer: Self-pay | Admitting: Family Medicine

## 2024-03-09 ENCOUNTER — Ambulatory Visit: Payer: Self-pay | Admitting: Family Medicine

## 2024-03-09 NOTE — Progress Notes (Signed)
 Her x-ray was normal.  She should let us  know if pain does not improve with physical therapy as we discussed at her office visit.

## 2024-03-11 ENCOUNTER — Ambulatory Visit: Payer: Self-pay

## 2024-03-11 NOTE — Telephone Encounter (Signed)
 See note

## 2024-03-11 NOTE — Telephone Encounter (Signed)
 Recommend referral to sports medicine.  Worth HERO. Kennyth, MD 03/11/2024 9:03 AM

## 2024-03-11 NOTE — Telephone Encounter (Signed)
 FYI Only or Action Required?: Action required by provider: clinical question for provider and update on patient condition.  Patient was last seen in primary care on 03/05/2024 by Kennyth Worth HERO, MD.  Called Nurse Triage reporting Shoulder Pain.  Symptoms began 2 weeks ago.  Symptoms are: gradually worsening.  Triage Disposition: See PCP When Office is Open (Within 3 Days)  Patient/caregiver understands and will follow disposition?: No, wishes to speak with PCP      Copied from CRM #[id number]. Topic: Clinical - Red Word Triage >> Mar 11, 2024 12:15 PM Suzen RAMAN wrote: Red Word that prompted transfer to Nurse Triage: worsening shoulder pain after provider recommendations.       Reason for Disposition  [1] MODERATE pain (e.g., interferes with normal activities) AND [2] present > 3 days  Answer Assessment - Initial Assessment Questions Patient declined an appointment and wanted to let Dr. Kennyth know her pain has been worsening and wanted to know if he had any further recommendations. Please advise.      1. ONSET: When did the pain start?     2 weeks ago  2. LOCATION: Where is the pain located?     Right shoulder  3. PAIN: How bad is the pain? (Scale 1-10; or mild, moderate, severe)     8/10 at it's worse  4. WORK OR EXERCISE: Has there been any recent work or exercise that involved this part of the body?     No 5. CAUSE: What do you think is causing the shoulder pain?     Unsure  6. OTHER SYMPTOMS: Do you have any other symptoms? (e.g., neck pain, swelling, rash, fever, numbness, weakness)     Difficulty using arm due to pain  Protocols used: Shoulder Pain-A-AH

## 2024-03-12 ENCOUNTER — Other Ambulatory Visit: Payer: Self-pay | Admitting: *Deleted

## 2024-03-12 DIAGNOSIS — M25512 Pain in left shoulder: Secondary | ICD-10-CM | POA: Diagnosis not present

## 2024-03-12 DIAGNOSIS — M2569 Stiffness of other specified joint, not elsewhere classified: Secondary | ICD-10-CM | POA: Diagnosis not present

## 2024-03-12 DIAGNOSIS — M25511 Pain in right shoulder: Secondary | ICD-10-CM

## 2024-03-12 DIAGNOSIS — M6281 Muscle weakness (generalized): Secondary | ICD-10-CM | POA: Diagnosis not present

## 2024-03-12 NOTE — Telephone Encounter (Signed)
 Please see her MyChart note - we are referring her to sports medicine.  Worth HERO. Kennyth, MD 03/12/2024 11:51 AM

## 2024-03-17 DIAGNOSIS — M25511 Pain in right shoulder: Secondary | ICD-10-CM | POA: Diagnosis not present

## 2024-03-19 DIAGNOSIS — M6281 Muscle weakness (generalized): Secondary | ICD-10-CM | POA: Diagnosis not present

## 2024-03-19 DIAGNOSIS — M25512 Pain in left shoulder: Secondary | ICD-10-CM | POA: Diagnosis not present

## 2024-03-19 DIAGNOSIS — M2569 Stiffness of other specified joint, not elsewhere classified: Secondary | ICD-10-CM | POA: Diagnosis not present

## 2024-03-19 DIAGNOSIS — M25511 Pain in right shoulder: Secondary | ICD-10-CM | POA: Diagnosis not present

## 2024-03-23 DIAGNOSIS — M25511 Pain in right shoulder: Secondary | ICD-10-CM | POA: Diagnosis not present

## 2024-03-23 DIAGNOSIS — M6281 Muscle weakness (generalized): Secondary | ICD-10-CM | POA: Diagnosis not present

## 2024-03-23 DIAGNOSIS — M2569 Stiffness of other specified joint, not elsewhere classified: Secondary | ICD-10-CM | POA: Diagnosis not present

## 2024-03-23 DIAGNOSIS — M25512 Pain in left shoulder: Secondary | ICD-10-CM | POA: Diagnosis not present

## 2024-03-26 DIAGNOSIS — M6281 Muscle weakness (generalized): Secondary | ICD-10-CM | POA: Diagnosis not present

## 2024-03-26 DIAGNOSIS — M2569 Stiffness of other specified joint, not elsewhere classified: Secondary | ICD-10-CM | POA: Diagnosis not present

## 2024-03-26 DIAGNOSIS — M25511 Pain in right shoulder: Secondary | ICD-10-CM | POA: Diagnosis not present

## 2024-03-26 DIAGNOSIS — M25512 Pain in left shoulder: Secondary | ICD-10-CM | POA: Diagnosis not present

## 2024-03-27 ENCOUNTER — Ambulatory Visit: Payer: Self-pay

## 2024-03-27 NOTE — Telephone Encounter (Signed)
 Patient is scheduled to be seen next week in office by Dr. Kennyth. Please advise if patient needs to be seen sooner. Thank you.

## 2024-03-27 NOTE — Telephone Encounter (Signed)
 FYI Only or Action Required?: FYI only for provider: appointment scheduled on 04/02/24.  Patient was last seen in primary care on 03/05/2024 by Kennyth Worth HERO, MD.  Called Nurse Triage reporting Shoulder Pain.  Symptoms began several weeks ago.  Interventions attempted: OTC medications: Tylenol, Ice/heat application, and Other: steroid injection in clinic.  Symptoms are: gradually worsening.  Triage Disposition: See PCP Within 2 Weeks (overriding See PCP When Office is Open (Within 3 Days))  Patient/caregiver understands and will follow disposition?: Yes                           Copied from CRM #[id number]. Topic: Clinical - Red Word Triage >> Mar 27, 2024  8:29 AM Robinson H wrote: [facility] that prompted transfer to Nurse Triage: Seen sports medicine for right shoulder pain, performed ultrasound was normal and given injection, now left shoulder pain wants to know if doctor would recommend a cancer screening  Reason for Disposition  [1] MODERATE pain (e.g., interferes with normal activities) AND [2] present > 3 days  Answer Assessment - Initial Assessment Questions 1. ONSET: When did the pain start?     Originally saw PCP on 03/05/24 and was referred to sports medicine, states pain moved to left shoulder about 2 weeks ago 2. LOCATION: Where is the pain located?     Originally in right shoulder, now in left shoulder 3. PAIN: How bad is the pain? (Scale 1-10; or mild, moderate, severe)     States pain worsens throughout day, states location of pain in shoulders fluctuate 4. WORK OR EXERCISE: Has there been any recent work or exercise that involved this part of the body?     States she had recently done lat pull-downs at gym, but does not recall injury 5. CAUSE: What do you think is causing the shoulder pain?     Unsure, patient is concerned about cancer at this point, states x-ray and ultrasound from sports medicine were inconclusive 6. OTHER SYMPTOMS:  Do you have any other symptoms? (e.g., neck pain, swelling, rash, fever, numbness, weakness)     Weakness, swelling, denies numbness, denies redness, denies rash, patient reports full ROM 7. PREGNANCY: Is there any chance you are pregnant? When was your last menstrual period?     N/A    This RN scheduled first available appointment with PCP, that would work with patient's work schedule.  Protocols used: Shoulder Pain-A-AH

## 2024-03-30 NOTE — Telephone Encounter (Signed)
 Noted, appt 11/13.

## 2024-03-30 NOTE — Telephone Encounter (Signed)
 I understand her concern but it sounds like the sports medicine group found the source of her pain. I think it would be reasonable to wait a few weeks to see how she responds to the injection.

## 2024-03-31 DIAGNOSIS — M25511 Pain in right shoulder: Secondary | ICD-10-CM | POA: Diagnosis not present

## 2024-03-31 DIAGNOSIS — M25512 Pain in left shoulder: Secondary | ICD-10-CM | POA: Diagnosis not present

## 2024-03-31 DIAGNOSIS — M2569 Stiffness of other specified joint, not elsewhere classified: Secondary | ICD-10-CM | POA: Diagnosis not present

## 2024-03-31 DIAGNOSIS — M6281 Muscle weakness (generalized): Secondary | ICD-10-CM | POA: Diagnosis not present

## 2024-03-31 NOTE — Telephone Encounter (Signed)
 I notified patient of Dr. Carroll note. If no response within forty eight hours I will contact patient and follow up.

## 2024-04-02 ENCOUNTER — Ambulatory Visit: Admitting: Family Medicine

## 2024-04-02 DIAGNOSIS — M6281 Muscle weakness (generalized): Secondary | ICD-10-CM | POA: Diagnosis not present

## 2024-04-02 DIAGNOSIS — M25511 Pain in right shoulder: Secondary | ICD-10-CM | POA: Diagnosis not present

## 2024-04-02 DIAGNOSIS — M25512 Pain in left shoulder: Secondary | ICD-10-CM | POA: Diagnosis not present

## 2024-04-02 DIAGNOSIS — M2569 Stiffness of other specified joint, not elsewhere classified: Secondary | ICD-10-CM | POA: Diagnosis not present

## 2024-04-07 DIAGNOSIS — M6281 Muscle weakness (generalized): Secondary | ICD-10-CM | POA: Diagnosis not present

## 2024-04-07 DIAGNOSIS — M2569 Stiffness of other specified joint, not elsewhere classified: Secondary | ICD-10-CM | POA: Diagnosis not present

## 2024-04-07 DIAGNOSIS — M25511 Pain in right shoulder: Secondary | ICD-10-CM | POA: Diagnosis not present

## 2024-04-07 DIAGNOSIS — M25512 Pain in left shoulder: Secondary | ICD-10-CM | POA: Diagnosis not present

## 2024-04-08 DIAGNOSIS — M25511 Pain in right shoulder: Secondary | ICD-10-CM | POA: Diagnosis not present

## 2024-04-08 DIAGNOSIS — M6281 Muscle weakness (generalized): Secondary | ICD-10-CM | POA: Diagnosis not present

## 2024-04-08 DIAGNOSIS — M25512 Pain in left shoulder: Secondary | ICD-10-CM | POA: Diagnosis not present

## 2024-04-08 DIAGNOSIS — M2569 Stiffness of other specified joint, not elsewhere classified: Secondary | ICD-10-CM | POA: Diagnosis not present

## 2024-04-14 DIAGNOSIS — M2569 Stiffness of other specified joint, not elsewhere classified: Secondary | ICD-10-CM | POA: Diagnosis not present

## 2024-04-14 DIAGNOSIS — M25512 Pain in left shoulder: Secondary | ICD-10-CM | POA: Diagnosis not present

## 2024-04-14 DIAGNOSIS — M6281 Muscle weakness (generalized): Secondary | ICD-10-CM | POA: Diagnosis not present

## 2024-04-14 DIAGNOSIS — M25511 Pain in right shoulder: Secondary | ICD-10-CM | POA: Diagnosis not present

## 2024-04-15 DIAGNOSIS — M546 Pain in thoracic spine: Secondary | ICD-10-CM | POA: Diagnosis not present

## 2024-04-15 DIAGNOSIS — M549 Dorsalgia, unspecified: Secondary | ICD-10-CM | POA: Diagnosis not present

## 2024-04-15 DIAGNOSIS — M542 Cervicalgia: Secondary | ICD-10-CM | POA: Diagnosis not present

## 2024-04-21 DIAGNOSIS — M25512 Pain in left shoulder: Secondary | ICD-10-CM | POA: Diagnosis not present

## 2024-04-21 DIAGNOSIS — M25511 Pain in right shoulder: Secondary | ICD-10-CM | POA: Diagnosis not present

## 2024-04-21 DIAGNOSIS — M6281 Muscle weakness (generalized): Secondary | ICD-10-CM | POA: Diagnosis not present

## 2024-04-21 DIAGNOSIS — M2569 Stiffness of other specified joint, not elsewhere classified: Secondary | ICD-10-CM | POA: Diagnosis not present

## 2024-04-22 DIAGNOSIS — M25511 Pain in right shoulder: Secondary | ICD-10-CM | POA: Diagnosis not present

## 2024-04-22 DIAGNOSIS — M25512 Pain in left shoulder: Secondary | ICD-10-CM | POA: Diagnosis not present

## 2024-04-22 DIAGNOSIS — M2569 Stiffness of other specified joint, not elsewhere classified: Secondary | ICD-10-CM | POA: Diagnosis not present

## 2024-04-22 DIAGNOSIS — M6281 Muscle weakness (generalized): Secondary | ICD-10-CM | POA: Diagnosis not present

## 2024-04-28 DIAGNOSIS — M2569 Stiffness of other specified joint, not elsewhere classified: Secondary | ICD-10-CM | POA: Diagnosis not present

## 2024-04-28 DIAGNOSIS — M25511 Pain in right shoulder: Secondary | ICD-10-CM | POA: Diagnosis not present

## 2024-04-28 DIAGNOSIS — M25512 Pain in left shoulder: Secondary | ICD-10-CM | POA: Diagnosis not present

## 2024-04-28 DIAGNOSIS — M6281 Muscle weakness (generalized): Secondary | ICD-10-CM | POA: Diagnosis not present

## 2024-04-30 DIAGNOSIS — M6281 Muscle weakness (generalized): Secondary | ICD-10-CM | POA: Diagnosis not present

## 2024-04-30 DIAGNOSIS — M25512 Pain in left shoulder: Secondary | ICD-10-CM | POA: Diagnosis not present

## 2024-04-30 DIAGNOSIS — M2569 Stiffness of other specified joint, not elsewhere classified: Secondary | ICD-10-CM | POA: Diagnosis not present

## 2024-04-30 DIAGNOSIS — M25511 Pain in right shoulder: Secondary | ICD-10-CM | POA: Diagnosis not present

## 2024-05-05 DIAGNOSIS — M25511 Pain in right shoulder: Secondary | ICD-10-CM | POA: Diagnosis not present

## 2024-05-05 DIAGNOSIS — M6281 Muscle weakness (generalized): Secondary | ICD-10-CM | POA: Diagnosis not present

## 2024-05-05 DIAGNOSIS — M25512 Pain in left shoulder: Secondary | ICD-10-CM | POA: Diagnosis not present

## 2024-05-05 DIAGNOSIS — M2569 Stiffness of other specified joint, not elsewhere classified: Secondary | ICD-10-CM | POA: Diagnosis not present

## 2024-05-06 DIAGNOSIS — M25512 Pain in left shoulder: Secondary | ICD-10-CM | POA: Diagnosis not present

## 2024-05-06 DIAGNOSIS — M6281 Muscle weakness (generalized): Secondary | ICD-10-CM | POA: Diagnosis not present

## 2024-05-06 DIAGNOSIS — M25511 Pain in right shoulder: Secondary | ICD-10-CM | POA: Diagnosis not present

## 2024-05-06 DIAGNOSIS — M2569 Stiffness of other specified joint, not elsewhere classified: Secondary | ICD-10-CM | POA: Diagnosis not present

## 2024-05-12 DIAGNOSIS — M549 Dorsalgia, unspecified: Secondary | ICD-10-CM | POA: Diagnosis not present

## 2024-05-12 DIAGNOSIS — R253 Fasciculation: Secondary | ICD-10-CM | POA: Diagnosis not present

## 2024-05-12 DIAGNOSIS — R221 Localized swelling, mass and lump, neck: Secondary | ICD-10-CM | POA: Diagnosis not present

## 2024-05-12 DIAGNOSIS — M542 Cervicalgia: Secondary | ICD-10-CM | POA: Diagnosis not present

## 2024-05-13 ENCOUNTER — Ambulatory Visit: Payer: Self-pay

## 2024-05-13 NOTE — Telephone Encounter (Signed)
 FYI Only or Action Required?: FYI only for provider: appointment scheduled on 05/15/24.  Patient was last seen in primary care on 03/05/2024 by Sherri Worth HERO, MD.  Called Nurse Triage reporting Shoulder Pain and Mass.  Symptoms began about a month ago.  Interventions attempted: Nothing.  Symptoms are: unchanged.  Triage Disposition: See PCP When Office is Open (Within 3 Days)  Patient/caregiver understands and will follow disposition?: Yes   Copied from CRM #[id number]. Topic: Clinical - Red Word Triage >> May 13, 2024 11:10 AM Franky GRADE wrote: Red Word that prompted transfer to Nurse Triage: Patient has been experiencing shoulder pain for the last couple of months, the pain has worsen along with noticing a lump on the left shoulder.   Reason for Disposition  [1] Small swelling or lump AND [2] unexplained AND [3] present > 1 week  Answer Assessment - Initial Assessment Questions Scheduled 05/15/24  Advised call back/UC or ED if symptoms worsen. Patient verbalized understanding.  1. APPEARANCE of SWELLING: What does it look like?     Can't see it; denies redness, swelling, discolorization, hot to touch, weakness/ numbness 2. SIZE: How large is the swelling? (e.g., inches, cm; or compare to size of pinhead, tip of pen, eraser, coin, pea, grape, ping pong ball)      Size of strawberry 3. LOCATION: Where is the swelling located?     Above bilateral shoulder blades 4. ONSET: When did the swelling start?     Month ago; no swelling 5. COLOR: What color is it? Is there more than one color?     Same color as skin; not raised or visible 6. PAIN: Is there any pain? If Yes, ask: How bad is the pain? (Scale 1-10; or mild, moderate, severe)       No pain, just tightness when touching 7. ITCH: Does it itch? If Yes, ask: How bad is the itch?      no 8. CAUSE: What do you think caused the swelling?     unsure 9 OTHER SYMPTOMS: do you have any other symptoms? (e.g.,  fever) Denies rash, swelling, diff breathing, chest pain, fever chills n/v, HA, neck pain Reports full ROM  Protocols used: Skin Lump or Localized Swelling-A-AH

## 2024-05-15 ENCOUNTER — Encounter: Payer: Self-pay | Admitting: Internal Medicine

## 2024-05-15 ENCOUNTER — Ambulatory Visit: Admitting: Internal Medicine

## 2024-05-15 VITALS — BP 108/60 | HR 88 | Temp 98.0°F | Ht 66.0 in | Wt 135.0 lb

## 2024-05-15 DIAGNOSIS — G545 Neuralgic amyotrophy: Secondary | ICD-10-CM

## 2024-05-15 DIAGNOSIS — R253 Fasciculation: Secondary | ICD-10-CM | POA: Diagnosis not present

## 2024-05-15 DIAGNOSIS — F419 Anxiety disorder, unspecified: Secondary | ICD-10-CM

## 2024-05-15 DIAGNOSIS — R229 Localized swelling, mass and lump, unspecified: Secondary | ICD-10-CM | POA: Diagnosis not present

## 2024-05-15 LAB — COMPREHENSIVE METABOLIC PANEL WITH GFR
ALT: 24 U/L (ref 3–35)
AST: 19 U/L (ref 5–37)
Albumin: 4.4 g/dL (ref 3.5–5.2)
Alkaline Phosphatase: 27 U/L — ABNORMAL LOW (ref 39–117)
BUN: 12 mg/dL (ref 6–23)
CO2: 23 meq/L (ref 19–32)
Calcium: 9.1 mg/dL (ref 8.4–10.5)
Chloride: 103 meq/L (ref 96–112)
Creatinine, Ser: 0.72 mg/dL (ref 0.40–1.20)
GFR: 109.99 mL/min
Glucose, Bld: 90 mg/dL (ref 70–99)
Potassium: 4.1 meq/L (ref 3.5–5.1)
Sodium: 136 meq/L (ref 135–145)
Total Bilirubin: 0.5 mg/dL (ref 0.2–1.2)
Total Protein: 7.2 g/dL (ref 6.0–8.3)

## 2024-05-15 LAB — CK: Total CK: 60 U/L (ref 17–177)

## 2024-05-15 LAB — MAGNESIUM: Magnesium: 2 mg/dL (ref 1.5–2.5)

## 2024-05-15 MED ORDER — PREDNISONE 20 MG PO TABS: ORAL_TABLET | ORAL | 0 refills | Status: AC

## 2024-05-15 NOTE — Assessment & Plan Note (Addendum)
 Subcutaneous nodules of upper back and shoulders   Palpable subcutaneous nodules in the upper bilateral scapula, more prominent on the left. Nodules are movable, firm, and oval-shaped. Differential includes lymphadenitis or tendinitis, with cancer being unlikely. An ultrasound of the neck is ordered to evaluate the nodules.

## 2024-05-15 NOTE — Assessment & Plan Note (Addendum)
 Fasciculations, widespread   Widespread fasciculations for 2-3 weeks in cheeks, jaw, legs, shoulders, hands, abdomen, and back, without associated symptoms like tongue fasciculations or muscle weakness. 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 Stress may contribute. Low likelihood of ALS. An EMG is ordered, along with CMP, magnesium, CK, and B12 tests. She is referred to neurology for further evaluation.

## 2024-05-15 NOTE — Progress Notes (Addendum)
 ==============================  [facility]: [phone]   -- Medical Office Visit --  Patient: Sherri Moreno      Age: 33 y.o.       Sex:  female  Date:   05/15/2024 Today's Healthcare Provider: Bernardino KANDICE Cone, MD  ==============================   Chief Complaint: Shoulder Pain (Pt has had some shoulder pain on both shoulders has knot on the left side and the right. You can not see the knot she can feel it. She has some some physical therapy on shoulder has not helped at all. Knot came up on the left side first now the right said. She has had some scan in the past has not showed anything. She see sports meds DR as well,) and Muscle Pain  Discussed the use of AI scribe software for clinical note transcription with the patient, who gave verbal consent to proceed.  History of Present Illness 33 year old female who presents with bilateral shoulder pain and widespread fasciculations.  She has experienced right shoulder pain since early October, which later progressed to include the left shoulder. X-rays of the right shoulder, cervical, and thoracic regions were normal. An injection in the right shoulder provided minimal relief. She describes a 'movable, firm lump' along the levator area, unchanged in size over the past month, which is not tender but can become achy later.  Over the past two weeks, she has noticed widespread fasciculations beginning in the shoulders and spreading to the right cheek, jaw, legs, hands, abdomen, and back. She reports pain-related limitations in performing overhead tasks, but does not feel she has true muscle weakness.  Her past medical history includes a thyroid  evaluation in September, which showed a slightly elevated TSH with normal T3 and T4 levels. Vitamin D and B12 levels checked around Thanksgiving were normal. She is scheduled for electrolyte and creatinine kinase tests in March. No recent viral illnesses, heavy alcohol  use, or increased caffeine intake. 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  Triage for this visit: {No specialty comments available.  Background Reviewed: Problem List: has Elevated LDL cholesterol level; Bloating; Dermatitis; Subcutaneous nodules; and Fasciculations on their problem list. Past Medical History:  has no past medical history on file. Past Surgical History:   has a past surgical history that includes Placement of breast implants. Social History:   reports that she has never smoked. She has never used smokeless tobacco. She reports current alcohol use. She reports that she does not use drugs. Family History:  family history includes Depression in her mother; Heart attack in her father. Allergies:  is allergic to ibuprofen.   Medication Reconciliation: Current Outpatient Medications on File Prior to Visit  Medication Sig   loratadine (CLARITIN) 10 MG tablet 1 tablet (Patient taking differently: daily as needed for allergies.)   Norethindrone Acetate-Ethinyl Estradiol (JUNEL 1.5/30) 1.5-30 MG-MCG tablet Take 1 tablet by mouth daily.   psyllium (HYDROCIL/METAMUCIL) 95 % PACK Take 1 packet by mouth daily.   No current facility-administered medications on file prior to visit.  There are no discontinued medications.   Physical Exam:    05/15/2024    9:58 AM 03/05/2024    7:46 AM 01/28/2024    8:08 AM  Vitals with BMI  Height 5' 6 5' 6 5' 6  Weight 135 lbs 143 lbs 142 lbs 3 oz  BMI 21.8 23.09 22.96  Systolic  108 102 114  Diastolic 60 64 71  Pulse 88 91 116  Vital signs reviewed.  Nursing notes reviewed. Weight trend reviewed. Physical Activity: Sufficiently Active (07/16/2023)   Exercise Vital Sign    Days of Exercise per Week: 4 days    Minutes of Exercise per Session: 40 min   General Appearance:  No acute distress appreciable.   Well-groomed, healthy-appearing  female.  Well proportioned with no abnormal fat distribution.  Good muscle tone. Pulmonary:  Normal work of breathing at rest, no respiratory distress apparent. SpO2: 98 %  Musculoskeletal: All extremities are intact.  Neurological:  Awake, alert, oriented, and engaged.  No obvious focal neurological deficits or cognitive impairments.  Sensorium seems unclouded.   Speech is clear and coherent with logical content. Psychiatric:  Appropriate mood, pleasant and cooperative demeanor, thoughtful and engaged during the exam   Verbalized to patient: Physical Exam MUSCULOSKELETAL: Subcutaneous nodules palpable in the upper bilateral scapula about 1 cm ovoid on left and slightly smaller on right. NEUROLOGICAL: Fasciculations present in multiple locations by report only, not directly observed today.     03/05/2024    7:50 AM 01/28/2024    8:06 AM 07/18/2023    8:17 AM 01/24/2023    8:13 AM  PHQ 2/9 Scores  PHQ - 2 Score 0 0 0 0    Office Visit on 05/15/2024  Component Date Value Ref Range Status   Magnesium 05/15/2024 2.0  1.5 - 2.5 mg/dL Final   Total CK 87/73/7974 60  17 - 177 U/L Final   TSH W/REFLEX TO FT4 05/15/2024 1.74  mIU/L Final   Sodium 05/15/2024 136  135 - 145 mEq/L Final   Potassium 05/15/2024 4.1  3.5 - 5.1 mEq/L Final   Chloride 05/15/2024 103  96 - 112 mEq/L Final   CO2 05/15/2024 23  19 - 32 mEq/L Final   Glucose, Bld 05/15/2024 90  70 - 99 mg/dL Final   BUN 87/73/7974 12  6 - 23 mg/dL Final   Creatinine, Ser 05/15/2024 0.72  0.40 - 1.20 mg/dL Final   Total Bilirubin 05/15/2024 0.5  0.2 - 1.2 mg/dL Final   Alkaline Phosphatase 05/15/2024 27 (L)  39 - 117 U/L Final   AST 05/15/2024 19  5 - 37 U/L Final   ALT 05/15/2024 24  3 - 35 U/L Final   Total Protein 05/15/2024 7.2  6.0 - 8.3 g/dL Final   Albumin 87/73/7974 4.4  3.5 - 5.2 g/dL Final   GFR 87/73/7974 109.99  >60.00 mL/min Final   Calcium 05/15/2024 9.1  8.4 - 10.5 mg/dL Final  Lab on 90/83/7974  Component Date Value  Ref Range Status   TSH 02/04/2024 3.74  0.35 - 5.50 uIU/mL Final   Free T4 02/04/2024 0.79  0.60 - 1.60 ng/dL Final   T3, Free 90/83/7974 3.5  2.3 - 4.2 pg/mL Final  Office Visit on 01/28/2024  Component Date Value Ref Range Status   WBC 01/28/2024 6.1  4.0 - 10.5 K/uL Final   RBC 01/28/2024 4.28  3.87 - 5.11 Mil/uL Final   Platelets 01/28/2024 263.0  150.0 - 400.0 K/uL Final   Hemoglobin 01/28/2024 13.4  12.0 - 15.0 g/dL Final   HCT 90/90/7974 39.7  36.0 - 46.0 % Final   MCV 01/28/2024 92.8  78.0 - 100.0 fl Final   MCHC 01/28/2024 33.7  30.0 - 36.0 g/dL Final   RDW 90/90/7974 13.2  11.5 - 15.5 % Final   Sodium 01/28/2024 138  135 -  145 mEq/L Final   Potassium 01/28/2024 4.0  3.5 - 5.1 mEq/L Final   Chloride 01/28/2024 107  96 - 112 mEq/L Final   CO2 01/28/2024 23  19 - 32 mEq/L Final   Glucose, Bld 01/28/2024 90  70 - 99 mg/dL Final   BUN 90/90/7974 13  6 - 23 mg/dL Final   Creatinine, Ser 01/28/2024 0.79  0.40 - 1.20 mg/dL Final   Total Bilirubin 01/28/2024 0.3  0.2 - 1.2 mg/dL Final   Alkaline Phosphatase 01/28/2024 27 (L)  39 - 117 U/L Final   AST 01/28/2024 27  0 - 37 U/L Final   ALT 01/28/2024 32  0 - 35 U/L Final   Total Protein 01/28/2024 7.0  6.0 - 8.3 g/dL Final   Albumin 90/90/7974 4.1  3.5 - 5.2 g/dL Final   GFR 90/90/7974 98.60  >60.00 mL/min Final   Calcium 01/28/2024 9.0  8.4 - 10.5 mg/dL Final   Hgb J8r MFr Bld 01/28/2024 5.7  4.6 - 6.5 % Final   TSH 01/28/2024 5.63 (H)  0.35 - 5.50 uIU/mL Final   Cholesterol 01/28/2024 188  0 - 200 mg/dL Final   Triglycerides 90/90/7974 55.0  0.0 - 149.0 mg/dL Final   HDL 90/90/7974 54.20  >39.00 mg/dL Final   VLDL 90/90/7974 11.0  0.0 - 40.0 mg/dL Final   LDL Cholesterol 01/28/2024 123 (H)  0 - 99 mg/dL Final   Total CHOL/HDL Ratio 01/28/2024 3   Final   NonHDL 01/28/2024 133.84   Final  Appointment on 09/17/2023  Component Date Value Ref Range Status   Calprotectin, Fecal 09/17/2023 11  0 - 120 ug/g Final  Appointment on  08/30/2023  Component Date Value Ref Range Status   Immunoglobulin A 08/30/2023 257  47 - 310 mg/dL Final   (tTG) Ab, IgG 95/88/7974 <1.0  U/mL Final   (tTG) Ab, IgA 08/30/2023 <1.0  U/mL Final   CRP 08/30/2023 <1.0  0.5 - 20.0 mg/dL Final   WBC 95/88/7974 6.6  4.0 - 10.5 K/uL Final   RBC 08/30/2023 4.38  3.87 - 5.11 Mil/uL Final   Hemoglobin 08/30/2023 13.8  12.0 - 15.0 g/dL Final   HCT 95/88/7974 41.5  36.0 - 46.0 % Final   MCV 08/30/2023 94.6  78.0 - 100.0 fl Final   MCHC 08/30/2023 33.2  30.0 - 36.0 g/dL Final   RDW 95/88/7974 13.1  11.5 - 15.5 % Final   Platelets 08/30/2023 315.0  150.0 - 400.0 K/uL Final   Neutrophils Relative % 08/30/2023 28.9 (L)  43.0 - 77.0 % Final   Lymphocytes Relative 08/30/2023 59.6 Repeated and verified X2. (H)  12.0 - 46.0 % Final   Monocytes Relative 08/30/2023 9.2  3.0 - 12.0 % Final   Eosinophils Relative 08/30/2023 1.5  0.0 - 5.0 % Final   Basophils Relative 08/30/2023 0.8  0.0 - 3.0 % Final   Neutro Abs 08/30/2023 1.9  1.4 - 7.7 K/uL Final   Lymphs Abs 08/30/2023 3.9  0.7 - 4.0 K/uL Final   Monocytes Absolute 08/30/2023 0.6  0.1 - 1.0 K/uL Final   Eosinophils Absolute 08/30/2023 0.1  0.0 - 0.7 K/uL Final   Basophils Absolute 08/30/2023 0.1  0.0 - 0.1 K/uL Final  Office Visit on 01/24/2023  Component Date Value Ref Range Status   WBC 01/24/2023 6.7  4.0 - 10.5 K/uL Final   RBC 01/24/2023 4.31  3.87 - 5.11 Mil/uL Final   Platelets 01/24/2023 270.0  150.0 - 400.0 K/uL Final  Hemoglobin 01/24/2023 13.5  12.0 - 15.0 g/dL Final   HCT 90/94/7975 41.1  36.0 - 46.0 % Final   MCV 01/24/2023 95.5  78.0 - 100.0 fl Final   MCHC 01/24/2023 32.9  30.0 - 36.0 g/dL Final   RDW 90/94/7975 12.8  11.5 - 15.5 % Final   Sodium 01/24/2023 137  135 - 145 mEq/L Final   Potassium 01/24/2023 4.2  3.5 - 5.1 mEq/L Final   Chloride 01/24/2023 106  96 - 112 mEq/L Final   CO2 01/24/2023 24  19 - 32 mEq/L Final   Glucose, Bld 01/24/2023 81  70 - 99 mg/dL Final   BUN  90/94/7975 13  6 - 23 mg/dL Final   Creatinine, Ser 01/24/2023 0.82  0.40 - 1.20 mg/dL Final   Total Bilirubin 01/24/2023 0.4  0.2 - 1.2 mg/dL Final   Alkaline Phosphatase 01/24/2023 25 (L)  39 - 117 U/L Final   AST 01/24/2023 19  0 - 37 U/L Final   ALT 01/24/2023 20  0 - 35 U/L Final   Total Protein 01/24/2023 7.1  6.0 - 8.3 g/dL Final   Albumin 90/94/7975 4.0  3.5 - 5.2 g/dL Final   GFR 90/94/7975 94.96  >60.00 mL/min Final   Calcium 01/24/2023 8.9  8.4 - 10.5 mg/dL Final   Hgb J8r MFr Bld 01/24/2023 5.5  4.6 - 6.5 % Final   TSH 01/24/2023 3.42  0.35 - 5.50 uIU/mL Final   Cholesterol 01/24/2023 194  0 - 200 mg/dL Final   Triglycerides 90/94/7975 67.0  0.0 - 149.0 mg/dL Final   HDL 90/94/7975 60.70  >39.00 mg/dL Final   VLDL 90/94/7975 13.4  0.0 - 40.0 mg/dL Final   LDL Cholesterol 01/24/2023 120 (H)  0 - 99 mg/dL Final   Total CHOL/HDL Ratio 01/24/2023 3   Final   NonHDL 01/24/2023 133.27   Final  No image results found. DG Shoulder Right Result Date: 03/08/2024 EXAM: 1 VIEW XRAY OF THE RIGHT SHOULDER 03/05/2024 08:41:26 AM COMPARISON: None available. CLINICAL HISTORY: right shoulder pain. Pt c/o R shoulder pain x 2 wks, thought it was a pulled muscle after a workout but pain has not subsided. States her acromion sticks out more than the L one. FINDINGS: BONES AND JOINTS: Glenohumeral joint is normally aligned. No acute fracture or dislocation. The [facility] joint is unremarkable in appearance. SOFT TISSUES: No abnormal calcifications. Visualized lung is unremarkable. IMPRESSION: 1. Normal shoulder radiographs without acute osseous abnormality or malalignment. Electronically signed by: Donnice Mania MD 03/08/2024 11:14 PM EDT RP Workstation: [id number]       ASSESSMENT & PLAN   Assessment & Plan Subcutaneous nodules Subcutaneous nodules of upper back and shoulders   Palpable subcutaneous nodules in the upper bilateral scapula, more prominent on the left. Nodules are movable, firm, and  oval-shaped. Differential includes lymphadenitis or tendinitis, with cancer being unlikely. An ultrasound of the neck is ordered to evaluate the nodules. Fasciculations Fasciculations, widespread   Widespread fasciculations for 2-3 weeks in cheeks, jaw, legs, shoulders, hands, abdomen, and back, without associated symptoms like tongue fasciculations or muscle weakness. 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 Stress may contribute. Low likelihood of ALS. An EMG is ordered, along with CMP, magnesium, CK, and B12 tests. She is referred to neurology for further evaluation. 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 Pain has improved, but weakness may develop. Differential includes benign fasciculation syndrome and ALS, though ALS is unlikely. Parsonage-Turner syndrome may follow infections, vaccinations, or occur without a clear trigger. Recovery varies, with some experiencing symptoms for up to three years. High-dose steroids in the first month may help if it is an immune response to a flu shot; its not a clearcut diagnosis but prescribed prednisone  burst to see if its effective.  Steroid shot in shoulder was not.. She is referred to neurology for EMG, prescribed prednisone , and provided with a handout on Parsonage-Turner syndrome.  Addendum: During visit, patient expressed concern about fasciculations and ALS after reading online. Reassurance provided. Documentation updated per patient preference regarding diagnostic terminology.  ORDER ASSOCIATIONS  #   DIAGNOSIS / CONDITION ICD-10 ENCOUNTER ORDER     ICD-10-CM   1. Subcutaneous nodules  R22.9 US  Soft Tissue Head/Neck (NON-THYROID )    2. 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  (DELTASONE ) 20 MG tablet         Orders Placed in Encounter:   Lab Orders         Magnesium         CK (Creatine Kinase)         TSH + free T4         Comprehensive metabolic panel with GFR     Imaging Orders         US  Soft Tissue Head/Neck (NON-THYROID )     Referral Orders         Ambulatory referral to Neurology     Meds ordered this encounter  Medications   predniSONE  (DELTASONE ) 20 MG tablet    Sig: Take 2 pills for 3 days, 1 pill for 4 days    Dispense:  10 tablet    Refill:  0    Orders Placed This Encounter  Procedures   US  Soft Tissue Head/Neck (NON-THYROID )    ### Ultrasound Order for Bilateral Scapular Nodules     **Examination Requested:** Ultrasound of bilateral upper scapular regions      **Clinical Indication:** Palpable subcutaneous nodules in bilateral upper scapular regions in patient with bilateral shoulder pain and fasciculations      **Clinical History:** 66-something-year-old patient with bilateral shoulder pain onset 2-3 months ago, now with widespread fasciculations for 2-3 weeks. Physical examination reveals palpable subcutaneous nodules in the bilateral upper scapular regions.      **Differential Diagnosis for Nodules:**      - Elastofibroma dorsi (benign subscapular soft tissue mass)[1][2]      - Myofascial trigger points[3]      - Lipomas      - Other soft tissue masses      **Specific Questions to Address:**      - Characterize the location, size, and echogenicity of the nodules      - Assess relationship to scapula, chest wall, and surrounding musculature      - Evaluate for features consistent with elastofibroma (subscapular location, fibrous appearance with linear echogenic strands)      - Assess for features of lipoma (hyperechoic, well-circumscribed)      -  Determine if nodules are within muscle, subcutaneous tissue, or other anatomic location      - Evaluate for any associated bursitis or other  pathology      **Additional Notes:** Patient is being concurrently evaluated for possible neuralgic amyotrophy. The relationship between the nodules and neurologic symptoms is unclear, but characterization will help guide management.      ### References  1. Elastofibroma Scapulae. Vastamki M. Clinical Orthopaedics and Related Research. 2001;(392):404-8. 2. Elastofibroma: A Subscapular Mass. Majeski J. American Journal of Surgery. 2008;196(1):93-4. doi:10.1016/j.amjsurg.2007.06.031. 3. Myofascial Pain Syndrome: An Update on Clinical Characteristics, Etiopathogenesis, Diagnosis, and Treatment. Steen JP, Jaiswal KS, Kumbhare D. Muscle & Nerve. 2025;71(5):889-910. doi:10.1002/mus.71622.    Standing Status:   Future    Expiration Date:   05/15/2025    Reason for Exam (SYMPTOM  OR DIAGNOSIS REQUIRED):   subcutaneous nodules- shoulder mass    Preferred imaging location?:   [facility]   Magnesium   CK (Creatine Kinase)   TSH + free T4   Comprehensive metabolic panel with GFR   Ambulatory referral to Neurology    Referral Priority:   Urgent    Referral Type:   Consultation    Referral Reason:   Specialty Services Required    Requested Specialty:   Neurology    Number of Visits Requested:   1   ED Discharge Orders          Ordered    US  Soft Tissue Head/Neck (NON-THYROID )       Comments: ### Ultrasound Order for Bilateral Scapular Nodules     **Examination Requested:** Ultrasound of bilateral upper scapular regions      **Clinical Indication:** Palpable subcutaneous nodules in bilateral upper scapular regions in patient with bilateral shoulder pain and fasciculations      **Clinical History:** 74-something-year-old patient with bilateral shoulder pain onset 2-3 months ago, now with widespread fasciculations for 2-3 weeks. Physical examination  reveals palpable subcutaneous nodules in the bilateral upper scapular regions.      **Differential Diagnosis for Nodules:**      - Elastofibroma dorsi (benign subscapular soft tissue mass)[1][2]      - Myofascial trigger points[3]      - Lipomas      - Other soft tissue masses      **Specific Questions to Address:**      - Characterize the location, size, and echogenicity of the nodules      - Assess relationship to scapula, chest wall, and surrounding musculature      - Evaluate for features consistent with elastofibroma (subscapular location, fibrous appearance with linear echogenic strands)      - Assess for features of lipoma (hyperechoic, well-circumscribed)      - Determine if nodules are within muscle, subcutaneous tissue, or other anatomic location      - Evaluate for any associated bursitis or other pathology      **Additional Notes:** Patient is being concurrently evaluated for possible neuralgic amyotrophy. The relationship between the nodules and neurologic symptoms is unclear, but characterization will help guide management.      ### References  1. Elastofibroma Scapulae. Vastamki M. Clinical Orthopaedics and Related Research. 2001;(392):404-8. 2. Elastofibroma: A Subscapular Mass. Majeski J. American Journal of Surgery. 2008;196(1):93-4. doi:10.1016/j.amjsurg.2007.06.031. 3. Myofascial Pain Syndrome: An Update on Clinical Characteristics, Etiopathogenesis, Diagnosis, and Treatment. Steen JP, Jaiswal KS, Kumbhare D. Muscle & Nerve. 2025;71(5):889-910. doi:10.1002/mus.71622.   05/15/24 1106    Magnesium        05/15/24 1106    CK (Creatine Kinase)        05/15/24 1106    TSH + free T4        05/15/24  1106    Comprehensive metabolic panel with GFR        05/15/24 1106    Ambulatory referral to Neurology       Comments: ### Neurology Referral for Evaluation of Bilateral Shoulder Pain with Fasciculations     **Reason for Referral:** Evaluation of bilateral  shoulder pain with subsequent widespread fasciculations, concerning for neuralgic amyotrophy (Parsonage-Turner syndrome) versus other neuromuscular disorder.      **History of Present Illness:** This is a 29-something-year-old patient presenting with bilateral shoulder pain that began insidiously approximately 2-3 months ago. Over the past 2-3 weeks, the patient has developed widespread fasciculations occurring daily throughout the body. The patient also has palpable subcutaneous nodules in the bilateral upper scapular regions. There is no reported objective weakness, though formal strength testing has not been documented.      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 Alternative considerations include cervical radiculopathy with bilateral nerve root involvement, benign fasciculation syndrome, or other peripheral nerve disorders.      **Requested Evaluation:**      - Comprehensive neurological examination with detailed motor, sensory, and reflex assessment      - Evaluation for scapular winging, muscle atrophy, and patterns of weakness consistent with brachial plexus or individual nerve involvement      - Electromyography and nerve conduction studies to assess for denervation, neurogenic motor unit action potentials, and localization of nerve pathology[4]      - Consideration of high-resolution ultrasound or MRI of the brachial plexus if neuralgic amyotrophy is suspected, to evaluate for characteristic nerve swelling, hourglass constrictions, or fascicular entwinement[4][2]      - Assessment of need for early corticosteroid  therapy if neuralgic amyotrophy is confirmed, as early treatment may improve outcomes[1][5]      Thank you for your expertise in evaluating this patient.      ### References  1. Parsonage-Turner Syndrome and Hereditary Brachial Plexus Neuropathy. Meiling JB, Lynae BARON, Hilma PEDLAR, et al. [facility]. 2024;99(1):124-140. doi:10.1016/[email or web address].2023.06.011. 2. Neuralgic Amyotrophy: A Paradigm Shift in Diagnosis and Treatment. Gstoettner C, Mayer JA, Rassam S, et al. Journal of Neurology, Neurosurgery, and Psychiatry. 2020;91(8):879-888. doi:10.1136/jnnp-2020-323164. 3. Clinico-Diagnostic Features of Neuralgic Amyotrophy in Childhood. Rotondo E, Pellegrino LOISE, Di Timm BROCKS, et al. Neurological Sciences : Official Journal of the [place] Neurological Society and of the [place] Society of Clinical Neurophysiology. 2020;41(7):1735-1740. doi:10.1007/s10072-020-04314-8. 4. A Standardized Ultrasound Approach in Neuralgic Amyotrophy. Cignetti NE, Cox RS, Baute V, et al. Muscle & Nerve. 2023;67(1):3-11. doi:10.1002/mus.72294. 5. Neuralgic Amyotrophy: An Update on Diagnosis, Pathophysiology, and Treatment. Fleeta Fountain JJ, Groothuis JT, Van Alfen N. Muscle & Nerve. 2016;53(3):337-50. doi:10.1002/mus.74991.   05/15/24 1106    predniSONE  (DELTASONE ) 20 MG tablet        05/15/24 1106              This document was synthesized by artificial intelligence (Abridge) using HIPAA-compliant recording of the clinical interaction;   We discussed the use of AI scribe software for clinical note transcription with the patient, who gave verbal consent to proceed. additional Info: This encounter employed state-of-the-art, real-time, collaborative documentation. The patient actively reviewed and assisted in updating their electronic medical record on a shared screen, ensuring transparency and facilitating joint problem-solving for the problem list, overview, and plan.  This approach promotes accurate, informed care. The  treatment plan was discussed and reviewed in detail, including medication safety, potential side effects, and all patient questions. We confirmed understanding and comfort with the plan. Follow-up instructions were established, including contacting the office for any concerns, returning if symptoms worsen, persist, or new symptoms develop, and precautions for potential emergency department visits.

## 2024-05-15 NOTE — Patient Instructions (Addendum)
 It was a pleasure seeing you today! Your health and satisfaction are our top priorities.  Bernardino Cone, MD  VISIT SUMMARY: Today, we discussed your bilateral shoulder pain and widespread muscle twitching. We reviewed your symptoms, medical history, and recent test results. We have a plan to address your concerns and will follow up with additional tests and specialist consultations.  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 We suspect this due to your shoulder pain and muscle twitching. You are prescribed prednisone  and referred to neurology for further tests, including an EMG. Recovery can vary, and we provided you with a handout for more information.  -SUBCUTANEOUS NODULES OF UPPER BACK AND SHOULDERS: You have firm, movable lumps in your upper back and shoulders. These are likely benign but need further evaluation. We have ordered an ultrasound of your neck to better understand these nodules.  -FASCICULATIONS, WIDESPREAD: Fasciculations are muscle twitches that can occur without serious underlying conditions. Your widespread twitching is likely benign, but we are considering Parsonage-Turner syndrome as well. We have ordered several tests, including an EMG, CMP, magnesium, CK, and B12, and referred you to neurology for further evaluation.  INSTRUCTIONS: Please follow up with the neurology department for your EMG and other evaluations. Additionally, complete the ultrasound of your neck as ordered. Continue taking prednisone  as prescribed. If you have any new symptoms or concerns, contact our office.  Your Providers PCP: Kennyth Worth HERO, MD,  [phone]) Referring Provider: Kennyth Worth HERO, MD,  [phone])  NEXT STEPS: [x]  Early Intervention: Schedule sooner appointment, call our on-call services, or go to emergency room if there is any  significant Increase in pain or discomfort New or worsening symptoms Sudden or severe changes in your health [x]  Flexible Follow-Up: We recommend a Return in about 3 weeks (around 06/05/2024) for close follow up acute illness. for optimal routine care. This allows for progress monitoring and treatment adjustments. [x]  Preventive Care: Schedule your annual preventive care visit! It's typically covered by insurance and helps identify potential health issues early. [x]  Lab & X-ray Appointments: Incomplete tests scheduled today, or call to schedule. X-rays: [facility] (M-F, 8:30am-noon or 1pm-5pm). [x]  Medical Information Release: Sign a release form at front desk to obtain relevant medical information we don't have.  MAKING THE MOST OF OUR FOCUSED 20 MINUTE APPOINTMENTS: [x]   Clearly state your top concerns at the beginning of the visit to focus our discussion [x]   If you anticipate you will need more time, please inform the front desk during scheduling - we can book multiple appointments in the same week. [x]   If you have transportation problems- use our convenient video appointments or ask about transportation support. [x]   We can get down to business faster if you use MyChart to update information before the visit and submit non-urgent questions before your visit. Thank you for taking the time to provide details through MyChart.  Let our nurse know and she can import this information into your encounter documents.  Arrival and Wait Times: [x]   Arriving on time ensures that everyone receives prompt attention. [x]   Early morning (8a) and afternoon (1p) appointments tend to have shortest wait times. [x]   Unfortunately, we cannot delay appointments for late arrivals or hold slots during phone calls.  Getting Answers and Following Up [x]   Simple Questions & Concerns: For quick questions or basic follow-up after your visit, reach us  at [phone]  or MyChart messaging. [x]    Complex Concerns: If your concern is more complex, scheduling an appointment might be best. Discuss this with the staff to find the most suitable option. [x]   Lab & Imaging Results: We'll contact you directly if results are abnormal or you don't use MyChart. Most normal results will be on MyChart within 2-3 business days, with a review message from Dr. Jesus. Haven't heard back in 2 weeks? Need results sooner? Contact us  at [phone]. [x]   Referrals: Our referral coordinator will manage specialist referrals. The specialist's office should contact you within 2 weeks to schedule an appointment. Call us  if you haven't heard from them after 2 weeks.  Staying Connected [x]   MyChart: Activate your MyChart for the fastest way to access results and message us . See the last page of this paperwork for instructions on how to activate.  Bring to Your Next Appointment [x]   Medications: Please bring all your medication bottles to your next appointment to ensure we have an accurate record of your prescriptions. [x]   Health Diaries: If you're monitoring any health conditions at home, keeping a diary of your readings can be very helpful for discussions at your next appointment.  Billing [x]   X-ray & Lab Orders: These are billed by separate companies. Contact the invoicing company directly for questions or concerns. [x]   Visit Charges: Discuss any billing inquiries with our administrative services team.  Your Satisfaction Matters [x]   Share Your Experience: We strive for your satisfaction! If you have any complaints, or preferably compliments, please let Dr. Jesus know directly or contact our Practice Administrators, Manuelita Rubin or [employer], by asking at the front desk.   Reviewing Your Records [x]   Review this early draft of your clinical encounter notes below and the final encounter summary tomorrow on MyChart after its been completed.  All orders placed so far are visible here: Subcutaneous  nodules Assessment & Plan: Subcutaneous nodules of upper back and shoulders   Palpable subcutaneous nodules in the upper bilateral scapula, more prominent on the left. Nodules are movable, firm, and oval-shaped. Differential includes lymphadenitis or tendinitis, with cancer being unlikely. An ultrasound of the neck is ordered to evaluate the nodules.  Orders: -     US  SOFT TISSUE HEAD & NECK (NON-THYROID ); Future  Fasciculations Assessment & Plan: Fasciculations, widespread   Widespread fasciculations for 2-3 weeks in cheeks, jaw, legs, shoulders, hands, abdomen, and back, without associated symptoms like tongue fasciculations or muscle weakness. 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 Stress and anxiety may contribute. Low likelihood of ALS. An EMG is ordered, along with CMP, magnesium, CK, and B12 tests. She is referred to neurology for further evaluation.  Orders: -     Magnesium -     CK -     TSH + free T4 -     Comprehensive metabolic panel with GFR -     Ambulatory referral to Neurology  Parsonage-Turner syndrome -     predniSONE ; Take 2 pills for 3 days, 1 pill for 4 days  Dispense: 10 tablet; Refill: 0     What to Expect with Parsonage-Turner Syndrome (Neuralgic Amyotrophy)  What is Parsonage-Turner Syndrome?  Parsonage-Turner syndrome (also called neuralgic amyotrophy or brachial plexus neuritis) is a condition that affects the nerves in your shoulder and arm. It is thought to be caused by inflammation of these nerves, possibly triggered by your immune system.[1][2] While the exact cause is not fully understood, it can occur after  infections, vaccinations, surgery, or sometimes without any clear trigger.[1]  What Are the Typical Symptoms?  The condition usually follows a predictable pattern:[1][2]  Phase 1 - Pain (First 2-4 weeks): You will likely experience severe shoulder and arm pain that comes on suddenly. This pain is  often described as sharp, burning, or aching. The pain is typically most intense in the first few days to weeks and usually lasts about 4 weeks on average, though it can be shorter or longer.[3]  Phase 2 - Weakness (Develops as pain improves): As the pain begins to subside, you may notice weakness in your shoulder, arm, or hand. This weakness can develop within days to weeks after the pain starts. You might have difficulty lifting your arm, reaching overhead, or performing tasks that require shoulder strength. Muscle wasting (atrophy) may also occur in the affected muscles.[1][4]  Phase 3 - Recovery (Months to years): Recovery begins gradually, usually starting around 5-6 months after symptom onset.[5] However, the timeline varies significantly between individuals.  How Long Does Recovery Take?  Recovery from Parsonage-Turner syndrome is highly variable:[1][5][6]  - Some patients show complete recovery on nerve testing by 1 year (about 30-50% of cases).[5]  - Most patients make significant functional recovery, though this can take months to years.[1]  - Unfortunately, complete recovery does not occur in all patients. Studies show that approximately 40-60% of patients have some persistent symptoms even after 3 years.[6][3]  - Younger patients, those with involvement on only one side, and those with fewer muscles affected tend to recover better.[7]  - Bilateral (both sides) involvement is associated with slower and less complete recovery.[7]  What Treatments Are Available?  Pain Management: During the acute pain phase, your doctor may recommend:[1][8]  - High-dose corticosteroids (such as prednisone ) if started early (within the first month) may help shorten the duration of pain and potentially speed recovery, though evidence is limited.[9][8]  - Pain medications, which may include non-steroidal anti-inflammatory drugs (NSAIDs) and sometimes stronger pain medications for severe  pain.[1][8]  Physical Therapy and Rehabilitation: This is a critical part of your treatment:[1][10][11]  - Physical therapy should focus on restoring proper shoulder blade (scapular) movement and coordination.  - Occupational therapy can help with energy conservation strategies and adapting daily activities.  - A specialized rehabilitation program has been shown to significantly improve shoulder, arm, and hand function in patients with Parsonage-Turner syndrome.[10]  - Therapy should begin once the acute pain phase has passed and continue throughout your recovery.  Advanced Treatments: In some cases, additional interventions may be considered:[6]  - If nerve imaging (MRI or ultrasound) shows nerve constrictions or hourglass deformities, surgery may be an option if recovery does not occur after 18 months.[1][6]  - Tendon transfers can help restore function when specific nerves do not recover.[1]  What Should You Expect Long-Term?  It's important to have realistic expectations:[6][11][3]  - While many patients make dramatic functional recovery, persistent symptoms are common. About two-thirds of patients followed for 3 years or more have some residual pain or weakness.[3]  - Decreased endurance in the affected arm is common even after strength returns.[11]  - About 25% of patients may remain unable to work at 3 years if the condition is not properly treated.[12]  - Recurrence can occur. In the idiopathic (non-hereditary) form, about 26% of patients experience another episode during follow-up.[3]  What Can You Do to Help Your Recovery?  - Attend all physical and occupational therapy sessions as recommended.  - Avoid overexertion of the  affected arm, but maintain gentle movement to prevent stiffness.  - Practice energy conservation techniques taught by your therapist.  - Work on scapular stabilization exercises as directed.  - Manage pain appropriately to allow participation in  rehabilitation.  - Be patient--recovery takes time, and improvement can continue for months to years.  - Follow up regularly with your neurologist to monitor progress and adjust treatment as needed.  When Should You Contact Your Doctor?  Contact your healthcare provider if you experience:  - Worsening pain or new areas of weakness  - Difficulty breathing (which could indicate involvement of the nerve to your diaphragm)  - No improvement in pain after several weeks  - New symptoms in the opposite arm  - Concerns about your recovery progress  Important Points to Remember  - Parsonage-Turner syndrome is not a life-threatening condition, but it can significantly impact your daily activities and quality of life.[2]  - Recovery is the rule, but the timeline is unpredictable and often longer than initially expected.[1][6]  - Active participation in rehabilitation is essential for the best possible outcome.[10][11]  - This condition is different from other nerve problems and requires specialized management.[6]  - Your healthcare team is here to support you through your recovery journey. References Parsonage-Turner Syndrome and Hereditary Brachial Plexus Neuropathy. Meiling JB, Lynae BARON, Hilma PEDLAR, et al. [facility]. 2024;99(1):124-140. doi:10.1016/j.mayocp.2023.06.011. Clinical and Pathophysiological Concepts of Neuralgic Amyotrophy. van Alfen N. Nature Reviews. Neurology. 2011;7(6):315-22. doi:10.1038/nrneurol.2011.62. The Clinical Spectrum of Neuralgic Amyotrophy in 246 Cases. van Alfen N, van Engelen BG. Brain : A Journal of Neurology. 2006;129(Pt 2):438-50. doi:10.1093/brain/awh722. Idiopathic Brachial Neuritis. Arlys BARON. Neurosurgery. 7990;34(5 Suppl):A150-2. doi:10.1227/01.NEU.(973)515-5309.59438.D1. The Electrodiagnostic Natural History of Parsonage-Turner Syndrome. [facility], Leontine ET, Boachie-Adjei K, et al. Muscle & Nerve. 2017;56(4):737-743.  doi:10.1002/mus.25558. Neuralgic Amyotrophy: A Paradigm Shift in Diagnosis and Treatment. Gstoettner C, Mayer JA, Rassam S, et al. Journal of Neurology, Neurosurgery, and Psychiatry. 2020;91(8):879-888. doi:10.1136/jnnp-2020-323164. Predisposing Factors for Incomplete Spontaneous Recovery After Parsonage-Turner Syndrome. Orlan AP, Jama POPLIN, Tagliero LE, et al. Acta Neurochirurgica. 2024;166(1):451. doi:10.1007/s00701-024-06350-1. Neuralgic Amyotrophy: An Update on Diagnosis, Pathophysiology, and Treatment. Fleeta Fountain JJ, Groothuis JT, Van Alfen N. Muscle & Nerve. 2016;53(3):337-50. doi:10.1002/mus.74991. Treatment for Idiopathic and Hereditary Neuralgic Amyotrophy (Brachial Neuritis). fleeta Fontaine SAILOR, van Engelen BG, Hughes RA. The Cochrane Database of Systematic Reviews. 2009;(3):CD006976. doi:10.1002/14651858.RI993023.ela7. Effectiveness of an Outpatient Rehabilitation Programme in Patients With Neuralgic Amyotrophy and Scapular Dyskinesia: A Randomised Controlled Trial. Paulino RMJ, Lustenhouwer R, Cup EHC, et al. Journal of Neurology, Neurosurgery, and Psychiatry. 2023;94(6):474-481. doi:10.1136/jnnp-2022-330296. Neuralgic Amyotrophy. IJspeert J, Janssen RMJ, van Alfen N. Current Opinion in Neurology. 2021;34(5):605-612. doi:10.1097/WCO.0000000000000968. Neuralgic Amyotrophy. Holle JF, Limmroth V, Windisch W, Zimmerman M. [employer]. 2024;121(15):483-489. doi:10.3238/arztebl.m2024.0077.

## 2024-05-16 ENCOUNTER — Ambulatory Visit: Payer: Self-pay | Admitting: Internal Medicine

## 2024-05-16 LAB — TSH+FREE T4: TSH W/REFLEX TO FT4: 1.74 m[IU]/L

## 2024-05-18 NOTE — Progress Notes (Signed)
 read by Olam Rouge at 8:38PM on 05/16/2024.

## 2024-05-20 ENCOUNTER — Telehealth: Payer: Self-pay | Admitting: *Deleted

## 2024-05-20 DIAGNOSIS — R229 Localized swelling, mass and lump, unspecified: Secondary | ICD-10-CM

## 2024-05-20 NOTE — Telephone Encounter (Signed)
 Copied from CRM #[id number]. Topic: Clinical - Request for Lab/Test Order >> May 20, 2024  9:22 AM Rea BROCKS wrote: Reason for CRM: [place] from [facility] called in regards to request for an ultrasound soft tissue head and neck for patient but diagnosis is talking about a mass on shoulder.   If they need images on mass on shoulder area, they need an ultrasound right upper extremity for the soft tissue or ultrasound left upper extremity for the soft tissue correction.

## 2024-05-20 NOTE — Telephone Encounter (Signed)
 Dr. Jesus, please see message from Imaging.

## 2024-05-20 NOTE — Addendum Note (Signed)
 Addended by: Vernor Monnig G on: 05/20/2024 10:26 AM   Modules accepted: Orders

## 2024-05-22 NOTE — Telephone Encounter (Signed)
 Spoke with [facility] they have new orders and pt has appt next week

## 2024-05-22 NOTE — Telephone Encounter (Signed)
 Placed new orders provider did

## 2024-05-26 ENCOUNTER — Encounter: Payer: Self-pay | Admitting: Neurology

## 2024-06-02 ENCOUNTER — Ambulatory Visit
Admission: RE | Admit: 2024-06-02 | Discharge: 2024-06-02 | Disposition: A | Source: Ambulatory Visit | Attending: Internal Medicine

## 2024-06-02 DIAGNOSIS — R229 Localized swelling, mass and lump, unspecified: Secondary | ICD-10-CM

## 2024-06-09 ENCOUNTER — Ambulatory Visit: Payer: Self-pay | Admitting: Internal Medicine

## 2024-06-24 ENCOUNTER — Encounter: Admitting: Obstetrics and Gynecology

## 2024-07-08 ENCOUNTER — Encounter: Admitting: Obstetrics and Gynecology

## 2024-08-28 ENCOUNTER — Ambulatory Visit: Payer: Self-pay | Admitting: Neurology

## 2025-01-28 ENCOUNTER — Encounter: Admitting: Family Medicine
